# Patient Record
Sex: Female | Born: 1950 | Race: White | Hispanic: No | Marital: Married | Smoking: Never smoker
Health system: Southern US, Community
[De-identification: ages and names within clinical notes are randomized; demographics above are authoritative.]

## PROBLEM LIST (undated history)

## (undated) DIAGNOSIS — M8580 Other specified disorders of bone density and structure, unspecified site: Secondary | ICD-10-CM

## (undated) DIAGNOSIS — J189 Pneumonia, unspecified organism: Secondary | ICD-10-CM

## (undated) DIAGNOSIS — I10 Essential (primary) hypertension: Secondary | ICD-10-CM

## (undated) HISTORY — PX: TUBAL LIGATION: SHX77

## (undated) HISTORY — DX: Essential (primary) hypertension: I10

## (undated) HISTORY — PX: APPENDECTOMY: SHX54

## (undated) HISTORY — PX: ANKLE SURGERY: SHX546

## (undated) HISTORY — DX: Other specified disorders of bone density and structure, unspecified site: M85.80

## (undated) HISTORY — DX: Pneumonia, unspecified organism: J18.9

## (undated) HISTORY — PX: OTHER SURGICAL HISTORY: SHX169

---

## 1996-07-03 HISTORY — PX: OTHER SURGICAL HISTORY: SHX169

## 1998-02-23 ENCOUNTER — Encounter: Payer: Self-pay | Admitting: *Deleted

## 1998-02-23 ENCOUNTER — Ambulatory Visit (HOSPITAL_COMMUNITY): Admission: RE | Admit: 1998-02-23 | Discharge: 1998-02-23 | Payer: Self-pay | Admitting: *Deleted

## 1998-03-02 ENCOUNTER — Other Ambulatory Visit: Admission: RE | Admit: 1998-03-02 | Discharge: 1998-03-02 | Payer: Self-pay | Admitting: *Deleted

## 1999-03-17 ENCOUNTER — Ambulatory Visit (HOSPITAL_COMMUNITY): Admission: RE | Admit: 1999-03-17 | Discharge: 1999-03-17 | Payer: Self-pay | Admitting: *Deleted

## 1999-03-17 ENCOUNTER — Encounter: Payer: Self-pay | Admitting: *Deleted

## 1999-03-23 ENCOUNTER — Other Ambulatory Visit: Admission: RE | Admit: 1999-03-23 | Discharge: 1999-03-23 | Payer: Self-pay | Admitting: *Deleted

## 1999-03-24 ENCOUNTER — Encounter (INDEPENDENT_AMBULATORY_CARE_PROVIDER_SITE_OTHER): Payer: Self-pay

## 1999-03-24 ENCOUNTER — Other Ambulatory Visit: Admission: RE | Admit: 1999-03-24 | Discharge: 1999-03-24 | Payer: Self-pay | Admitting: *Deleted

## 2000-01-11 ENCOUNTER — Encounter: Payer: Self-pay | Admitting: Vascular Surgery

## 2000-01-12 ENCOUNTER — Encounter (INDEPENDENT_AMBULATORY_CARE_PROVIDER_SITE_OTHER): Payer: Self-pay | Admitting: Specialist

## 2000-01-12 ENCOUNTER — Ambulatory Visit (HOSPITAL_COMMUNITY): Admission: RE | Admit: 2000-01-12 | Discharge: 2000-01-12 | Payer: Self-pay | Admitting: Vascular Surgery

## 2000-05-01 ENCOUNTER — Encounter: Payer: Self-pay | Admitting: *Deleted

## 2000-05-01 ENCOUNTER — Ambulatory Visit (HOSPITAL_COMMUNITY): Admission: RE | Admit: 2000-05-01 | Discharge: 2000-05-01 | Payer: Self-pay | Admitting: *Deleted

## 2000-05-29 ENCOUNTER — Other Ambulatory Visit: Admission: RE | Admit: 2000-05-29 | Discharge: 2000-05-29 | Payer: Self-pay | Admitting: *Deleted

## 2001-09-11 ENCOUNTER — Encounter: Payer: Self-pay | Admitting: *Deleted

## 2001-09-11 ENCOUNTER — Ambulatory Visit (HOSPITAL_COMMUNITY): Admission: RE | Admit: 2001-09-11 | Discharge: 2001-09-11 | Payer: Self-pay | Admitting: Obstetrics and Gynecology

## 2001-09-30 ENCOUNTER — Other Ambulatory Visit: Admission: RE | Admit: 2001-09-30 | Discharge: 2001-09-30 | Payer: Self-pay | Admitting: *Deleted

## 2002-10-28 ENCOUNTER — Ambulatory Visit (HOSPITAL_COMMUNITY): Admission: RE | Admit: 2002-10-28 | Discharge: 2002-10-28 | Payer: Self-pay | Admitting: Obstetrics and Gynecology

## 2002-10-28 ENCOUNTER — Encounter: Payer: Self-pay | Admitting: *Deleted

## 2002-11-24 ENCOUNTER — Other Ambulatory Visit: Admission: RE | Admit: 2002-11-24 | Discharge: 2002-11-24 | Payer: Self-pay | Admitting: Obstetrics & Gynecology

## 2003-04-03 ENCOUNTER — Ambulatory Visit (HOSPITAL_COMMUNITY): Admission: RE | Admit: 2003-04-03 | Discharge: 2003-04-03 | Payer: Self-pay | Admitting: Gastroenterology

## 2004-02-09 ENCOUNTER — Ambulatory Visit (HOSPITAL_COMMUNITY): Admission: RE | Admit: 2004-02-09 | Discharge: 2004-02-09 | Payer: Self-pay | Admitting: Obstetrics & Gynecology

## 2005-02-24 ENCOUNTER — Ambulatory Visit (HOSPITAL_COMMUNITY): Admission: RE | Admit: 2005-02-24 | Discharge: 2005-02-24 | Payer: Self-pay | Admitting: Obstetrics & Gynecology

## 2006-10-24 ENCOUNTER — Ambulatory Visit (HOSPITAL_COMMUNITY): Admission: RE | Admit: 2006-10-24 | Discharge: 2006-10-24 | Payer: Self-pay | Admitting: Obstetrics & Gynecology

## 2008-05-21 ENCOUNTER — Ambulatory Visit (HOSPITAL_COMMUNITY): Admission: RE | Admit: 2008-05-21 | Discharge: 2008-05-21 | Payer: Self-pay | Admitting: Obstetrics & Gynecology

## 2008-12-28 ENCOUNTER — Ambulatory Visit: Payer: Self-pay | Admitting: Vascular Surgery

## 2009-04-05 ENCOUNTER — Ambulatory Visit: Payer: Self-pay | Admitting: Vascular Surgery

## 2009-06-14 ENCOUNTER — Ambulatory Visit: Payer: Self-pay | Admitting: Vascular Surgery

## 2009-06-22 ENCOUNTER — Ambulatory Visit: Payer: Self-pay | Admitting: Vascular Surgery

## 2009-08-05 ENCOUNTER — Ambulatory Visit: Payer: Self-pay | Admitting: Vascular Surgery

## 2009-09-29 ENCOUNTER — Ambulatory Visit (HOSPITAL_COMMUNITY): Admission: RE | Admit: 2009-09-29 | Discharge: 2009-09-29 | Payer: Self-pay | Admitting: Obstetrics & Gynecology

## 2010-11-15 NOTE — Consult Note (Signed)
NEW PATIENT CONSULTATION   Anna Sparks, Anna Sparks  DOB:  11/07/1950                                       12/28/2008  EAVWU#:[id number]   The patient is a 60 year old female patient well a long history of  venous insufficiency.  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  She has had some sclerotherapy several years ago for  these.  Her biggest problem today is symptomatic varicosities in the  right leg which have increased over the years and caused aching,  throbbing and burning discomfort in the right leg as well as swelling in  the right ankle as the day progresses.  She has no history of deep  venous thrombosis or thrombophlebitis.  No history of bleeding or  ulceration.  She does have swelling, as noted, in the right ankle and  increasing symptomatology as the day progresses.  She has not been  wearing elastic compression stockings nor elevating the legs on a  regular basis or taking pain medication.  She does feel that this is  affecting her daily living, however.   PAST MEDICAL HISTORY:  1. Hypertension.  2. Negative for coronary artery disease, diabetes, COPD or stroke.   PAST SURGICAL HISTORY:  1. Ligation and stripping left greater saphenous vein.  2. Appendectomy.  3. C-section x2.   FAMILY HISTORY:  Positive for stroke and valve replacement in her  mother, varicose veins, aneurysm and coronary artery disease in her  father.  Negative for diabetes.   SOCIAL HISTORY:  She is married and has 2 children, is retired.  Does  not use tobacco and drinks occasional alcohol.   REVIEW OF SYSTEMS:  Negative with no history of chest pain but does have  occasional palpitations.  Otherwise, no symptomatology.   ALLERGIES:  None known.   MEDICATIONS:  Please see health history form.   PHYSICAL  EXAMINATION:  Blood pressure is 130/80, heart rate 70,  respirations 14.  General:  She is a healthy-appearing middle-aged  female in no apparent distress, alert and oriented x3.  Neck:  Supple,  3+ carotid pulses palpable.  No bruits are audible.  Neurologic:  Normal.  No palpable adenopathy in the neck.  Chest:  Clear to  auscultation.  Cardiovascular:  Regular rhythm, no murmurs.  Abdomen:  Soft, nontender with no masses.  She has 3+ femoral, popliteal and  dorsalis pedis pulses bilaterally.  Both feet are well-perfused.  Right  leg has severe varicosities in the greater saphenous system in the  distal thigh and more severely in the medial calf with large bulging  varicosities.  There is also a prominent varicosity in the lower third  of the leg extending down laterally to the ankle with some spider and  reticular veins.  There is 1+ edema but no ulceration noted.  Left leg  has recurrent varicosities in the medial thigh which are smaller and  extend down into the medial calf with 1+ edema distally.  No  hyperpigmentation or ulceration is noted in either.   Venous duplex exam reveals the following:  1. Gross reflux in the right great saphenous system from the      saphenofemoral junction to  the knee.  2. Reflux in the deep system on the right side in the common femoral,      superficial femoral and popliteal veins in no deep venous      obstruction.  3. Left great saphenous vein is absent.  There is some reflux from the      knee to the ankle through a very tortuous vein in the left leg,      although no incompetent perforators are noted and there is deep      reflux on the left side as well.   I think the best plan would be to treat her for 3 months with long-leg  elastic compression stockings, elevation and ibuprofen on a daily basis.   If she has no improvement, she would be a good candidate for:  1. Laser ablation of the right great saphenous vein with multiple stab       phlebectomies to be followed by a course of sclerotherapy on the      right.  2. She will consider sclerotherapy for recurrent varicosities in the      left thigh and calf.  She will return in 3 months.   Anna Sparks, M.D.  Electronically Signed   JDL/MEDQ  D:  12/28/2008  T:  12/29/2008  Job:  [id number]   cc:   Gloriajean Dell. Andrey Campanile, M.D.

## 2010-11-15 NOTE — Procedures (Signed)
LOWER EXTREMITY VENOUS REFLUX EXAM   INDICATION:  Bilateral varicose veins.   EXAM:  Using color-flow imaging and pulse Doppler spectral analysis, the  bilateral common femoral, superficial femoral, popliteal, posterior  tibial, greater and lesser saphenous veins were evaluated.  There is  evidence suggesting deep venous insufficiency in the bilateral common  femoral, superficial femoral, and popliteal veins.   The bilateral saphenofemoral junctions are not competent.  The right GSV  is not competent ( with reflux of > 500 millisecond) , with the caliber  as described below.  The left greater saphenous vein is not visualized  from the groin to distal thigh level.   The left proximal short saphenous vein demonstrates competency.  The  right proximal short saphenous vein was not visualized.   GSV Diameter (used if found to be incompetent only)                                            Right    Left  Proximal Greater Saphenous Vein           0.97 cm  cm  Proximal-to-mid-thigh                     cm       cm  Mid thigh                                 0.85 cm  cm  Mid-distal thigh                           cm      cm  Distal thigh                               0.72 cm cm  Knee                                      0.9 cm   cm   IMPRESSION:  1. Right greater saphenous vein reflux ( with reflux of  >500      millisecond) is identified as described above.  2. The right greater saphenous vein is not tortuous.  3. The bilateral deep venous systems are not competent ( with reflux      of > 500 millisecond) .  4. An incompetent perforator is noted at the distal thigh level, as      described on the attached worksheet.  5. The left lesser saphenous vein is competent.        ___________________________________________  Quita Skye. Hart Rochester, M.D.   CH/MEDQ  D:  12/28/2008  T:  12/28/2008  Job:  [id number]

## 2010-11-15 NOTE — Assessment & Plan Note (Signed)
OFFICE VISIT   PAIDYN, MCFERRAN  DOB:  1950/10/07                                       06/22/2009  AVWUJ#:[id number]   The patient returns 1 week post laser ablation of the right great  saphenous vein with multiple stab phlebectomies for venous hypertension  and painful varicosities.  She has had some mild discomfort along the  course of the great saphenous vein but no distal edema.  She has been  wearing her elastic compression stocking and taking her ibuprofen as  directed.  On exam she has no distal edema and a palpable dorsalis pedis  pulse.  There is mild discomfort along the course of the great saphenous  vein in the mid thigh up to the saphenofemoral junction area.  Venous  duplex reveals no evidence of deep venous obstruction and total closure  of the great saphenous vein from the knee to the saphenofemoral  junction.  I reassured her regarding these findings.  She will return in  February for sclerotherapy in the contralateral left leg.     [name], M.D.  Electronically Signed   JDL/MEDQ  D:  06/22/2009  T:  06/23/2009  Job:  [id number]

## 2010-11-15 NOTE — Procedures (Signed)
DUPLEX DEEP VENOUS EXAM - LOWER EXTREMITY   INDICATION:  Follow up right greater saphenous vein ablation.   HISTORY:  Edema:  No  Trauma/Surgery:  Right greater saphenous vein ablation 06/14/2009  Pain:  No  PE:  No  Previous DVT:  No  Anticoagulants:  No   DUPLEX EXAM:                CFV   SFV   PopV  PTV    GSV                R  L  R  L  R  L  R   L  R  L  Thrombosis    0  0  0     0     0      +  Spontaneous   +  +  +     +     +      0  Phasic        +  +  +     +     +      0  Augmentation  +  +  +     +     +      0  Compressible  +  +  +     +     +      0  Competent     0  0  0     0     +      0   Legend:  + - yes  o - no  p - partial  D - decreased   IMPRESSION:  1. No evidence of a deep vein thrombosis in the right lower extremity      or left common femoral vein.  2. Evidence of right greater saphenous vein ablation throughout its      length without flow.  Greater saphenous vein thrombus extends to      saphenofemoral junction valve, however does not extend into the      common femoral vein.  3. Bilateral deep venous reflux noted.          _____________________________  [name], M.D.   AS/MEDQ  D:  06/22/2009  T:  06/23/2009  Job:  [id number]

## 2010-11-15 NOTE — Assessment & Plan Note (Signed)
OFFICE VISIT   Anna Sparks, Anna Sparks  DOB:  [birth year]                                       04/05/2009  AVWUJ#:[id number]   The patient returns today with continued symptoms related to her severe  venous insufficiency of the right leg secondary to gross reflux in the  right great saphenous vein and multiple varicosities.  She continues to  have aching, throbbing and burning discomfort not only when up and  ambulating, taking care of her normal activities such as cooking and  housework, but also has problems with aching and throbbing discomfort  while sitting despite wearing elastic compression stockings, taking  ibuprofen and elevating the legs as much as possible.  She has  documented reflux throughout the right great saphenous vein down to the  knee with secondary varicosities.  She also has bulging reticular veins  at the ankle with secondary spider veins.  She has previously had  ligation and stripping of her contralateral left great saphenous vein.   I think she is having significant symptomatology secondary to this which  is affecting her daily living and recommend that we should proceed with  laser ablation of the right great saphenous vein with multiple stab  phlebectomies to be followed by one course of sclerotherapy in the right  leg.  We will proceed with precertification for this and try to perform  it in the near future to help relieve this nice lady's symptoms.   [name], M.D.  Electronically Signed   JDL/MEDQ  D:  04/05/2009  T:  04/06/2009  Job:  [id number]

## 2010-11-18 NOTE — Op Note (Signed)
[facility]  Patient:    Anna Sparks, Anna Sparks                         MRN: [id number] Proc. Date: 01/12/00 Attending:  Quita Skye. Hart Rochester, M.D.                           Operative Report  PREOPERATIVE DIAGNOSIS:  Severe varicose veins greater saphenous system, left leg.  POSTOPERATIVE DIAGNOSIS:  Severe varicose veins greater saphenous system, left leg.  OPERATION PERFORMED:  Ligation and stripping of greater saphenous vein, left leg with excision of multiple varicosities left leg.  SURGEON:  Quita Skye. Hart Rochester, M.D.  FIRST ASSISTANT:  Nurse.  ANESTHESIA:  General endotracheal.  DESCRIPTION OF PROCEDURE:  The patient was taken to the operating room, placed in the upright position at which time the varicosities in the left leg were marked. These were mainly in the calf over the greater saphenous vein and extending posteriorly in the mid calf region. There were also 1 or 2 isolated varicosities in the thigh. The patient was returned in the supine position and general endotracheal anesthesia was administered. The left leg was prepped with Betadine solution and draped in a routine sterile manner. An incision was made anterior to the medial malleolus at the ankle in the left leg and the greater saphenous vein ligated distally and transected. The vein was occluded up at at least the mid calf region and an intraluminal stripper could not be passed. It was removed as much as possible through this incision. An incision was then made in the groin at the saphenofemoral junction through a short oblique incision. The saphenofemoral junction was dissected free and the saphenous vein ligated at the junction of the femoral vein. Its branches were ligated with 4-0 silk ties and divided. The vein was then opened and the stripper passed distally through the saphenous vein and was palpated at the knee level where a transverse incision was made over a nest of varicosities. The  vein was then transected proximally and a medium size stripper head secured with the 2-0 silk tie. Multiple short transverse incisions were made over marked varicosities and they were removed using both dissection and avulsion techniques including a prominent nest of varicosities near the ankle and in the distal calf. When these were all removed, the leg was wrapped with a 4 inch Ace wrap and Trendelenburg position employed. The vein was then stripped from proximal to distal and compression applied for 10 minutes. The wounds were then closed in a subcuticular fashion with 3-0 Vicryl and Steri-Strips. Sterile dressing applied consisting of 4 x 4s, Webril and an Ace wrap. The patient taken to the recovery room in satisfactory condition. DD:  01/12/00 TD:  01/12/00 Job: [id number]

## 2010-11-18 NOTE — Op Note (Signed)
   NAME:  [name], Anna Sparks                    ACCOUNT NO.:  [id number]   MEDICAL RECORD NO.:  [id number]                   PATIENT TYPE:  AMB   LOCATION:  ENDO                                 FACILITY:  [facility]   PHYSICIAN:  Anselmo Rod, M.D.               DATE OF BIRTH:  1951-07-01   DATE OF PROCEDURE:  04/03/2003  DATE OF DISCHARGE:                                 OPERATIVE REPORT   PROCEDURE:  Screening colonoscopy.   ENDOSCOPIST:  Anselmo Rod, M.D.   INSTRUMENT USED:  Olympus video colonoscope.   INDICATIONS FOR PROCEDURE:  A 60 year old white female undergoing a  screening colonoscopy to rule out colonic polyps, masses, etc.   PREPROCEDURE PREPARATION:  Informed consent was procured from the patient.  The patient was fasted for eight hours prior to the procedure and prepped  with a bottle of magnesium citrate and a gallon of GoLYTELY the night prior  to the procedure.   PREPROCEDURE PHYSICAL EXAMINATION:  VITAL SIGNS: Stable.  NECK:  Supple.  CHEST: Clear to auscultation. S1 and S2 regular.  ABDOMEN: Soft with normal bowel sounds.   DESCRIPTION OF PROCEDURE:  The patient was placed in the left lateral  decubitus position and sedated with 50 mg of Demerol and 5 mg of Versed  intravenously. Once the patient was adequately sedated and maintained on low  flow oxygen and continuous cardiac monitoring, the Olympus video colonoscope  was advanced from the rectum to the cecum and terminal ileum without  difficulty.  Multiple washings were done. No masses, polyps, erosions,  ulcerations, or diverticula were seen.  Small external hemorrhoid was seen  on anal inspection.  Retroflexion in the rectum revealed no abnormalities.  The appendiceal orifice and ileocecal valve were clearly visualized and  photographed. The terminal ileum appeared normal and without lesions.   IMPRESSION:  Normal colonoscopy up to the terminal ileum except for a small  external hemorrhoid.    RECOMMENDATIONS:  Continue a high fiber diet with liberal fluid intake.  Repeat CRC screening in the next 10 years unless the patient develops any  abnormal symptoms in the interim.  Outpatient follow-up on a p.r.n. basis.                                               Anselmo Rod, M.D.    JNM/MEDQ  D:  04/03/2003  T:  04/03/2003  Job:  [id number]   cc:   Genia Del, M.D.  [address]  [place]  [place] [address]  Fax: [phone]

## 2011-11-20 ENCOUNTER — Other Ambulatory Visit (HOSPITAL_COMMUNITY): Payer: Self-pay | Admitting: Obstetrics & Gynecology

## 2011-11-20 DIAGNOSIS — Z1231 Encounter for screening mammogram for malignant neoplasm of breast: Secondary | ICD-10-CM

## 2011-11-21 ENCOUNTER — Ambulatory Visit (HOSPITAL_COMMUNITY)
Admission: RE | Admit: 2011-11-21 | Discharge: 2011-11-21 | Disposition: A | Discharge status: Home / Self Care | Payer: 59 | Source: Ambulatory Visit | Attending: Obstetrics & Gynecology | Admitting: Obstetrics & Gynecology

## 2011-11-21 DIAGNOSIS — Z1231 Encounter for screening mammogram for malignant neoplasm of breast: Secondary | ICD-10-CM | POA: Insufficient documentation

## 2013-04-09 ENCOUNTER — Other Ambulatory Visit (HOSPITAL_COMMUNITY): Payer: Self-pay | Admitting: Obstetrics & Gynecology

## 2013-04-09 DIAGNOSIS — Z1231 Encounter for screening mammogram for malignant neoplasm of breast: Secondary | ICD-10-CM

## 2013-04-10 ENCOUNTER — Ambulatory Visit (HOSPITAL_COMMUNITY)
Admission: RE | Admit: 2013-04-10 | Discharge: 2013-04-10 | Disposition: A | Discharge status: Home / Self Care | Payer: 59 | Source: Ambulatory Visit | Attending: Obstetrics & Gynecology | Admitting: Obstetrics & Gynecology

## 2013-04-10 DIAGNOSIS — Z1231 Encounter for screening mammogram for malignant neoplasm of breast: Secondary | ICD-10-CM | POA: Insufficient documentation

## 2015-04-13 ENCOUNTER — Telehealth: Payer: Self-pay

## 2015-04-13 NOTE — Telephone Encounter (Signed)
Phone call from pt. with report of onset of a "dull ache in the right calf", when she got up this AM.  Reported the aching of the calf starts with walking only a few steps.  Stated it improved with sitting and resting.  Stated she is driving now, and doesn't feel the aching of right calf.  Denied any visible signs of inflammation; denied any swelling, redness, or warmth of right LE.  Denied any recent physical strain to the right LE.  Recommended to notify her PCP for initial evaluation; if PCP recommends to see Vascular Surgery, our office will, then, schedule appt.  Pt. verb. understanding.

## 2016-12-07 ENCOUNTER — Ambulatory Visit (INDEPENDENT_AMBULATORY_CARE_PROVIDER_SITE_OTHER): Payer: Medicare Other

## 2016-12-07 ENCOUNTER — Ambulatory Visit (INDEPENDENT_AMBULATORY_CARE_PROVIDER_SITE_OTHER): Payer: Medicare Other | Admitting: Orthopedic Surgery

## 2016-12-07 ENCOUNTER — Encounter (INDEPENDENT_AMBULATORY_CARE_PROVIDER_SITE_OTHER): Payer: Self-pay | Admitting: Orthopedic Surgery

## 2016-12-07 VITALS — Ht 64.0 in | Wt 170.0 lb

## 2016-12-07 DIAGNOSIS — M25561 Pain in right knee: Secondary | ICD-10-CM

## 2016-12-07 DIAGNOSIS — M25572 Pain in left ankle and joints of left foot: Secondary | ICD-10-CM | POA: Diagnosis not present

## 2016-12-07 DIAGNOSIS — S82851S Displaced trimalleolar fracture of right lower leg, sequela: Secondary | ICD-10-CM | POA: Diagnosis not present

## 2016-12-07 DIAGNOSIS — M25571 Pain in right ankle and joints of right foot: Secondary | ICD-10-CM

## 2016-12-07 DIAGNOSIS — S82851A Displaced trimalleolar fracture of right lower leg, initial encounter for closed fracture: Secondary | ICD-10-CM | POA: Insufficient documentation

## 2016-12-07 NOTE — Progress Notes (Signed)
Office Visit Note   Patient: Anna Sparks           Date of Birth: 1951/04/04           MRN: [id number] Visit Date: 12/07/2016              Requested by: No referring provider defined for this encounter. PCP: No primary care provider on file.  Chief Complaint  Patient presents with  . Right Ankle - Pain    Surgery in [place] about 11 days ago s/p fall  . Left Ankle - Pain  . Right Knee - Pain      HPI: Patient is a 66 year old woman who fell while in [place]. She underwent open reduction internal fixation of the medial and lateral malleolus fracture 4 a Weber B fracture. Patient also had a contusion to her right knee and left ankle and states that there was a concern while she was in [place] that there may be a fracture of her knee and ankle. Patient is approximately 2 weeks status post surgery.  Assessment & Plan: Visit Diagnoses:  1. Acute pain of right knee   2. Pain in left ankle and joints of left foot   3. Pain in right ankle and joints of right foot   4. Closed trimalleolar fracture of right ankle, sequela     Plan: We will harvest the sutures today H was given instructions for ankle range of motion she will be touchdown weightbearing on the right where fracture boot. She will wear a 15-20 mm compression stocking to decrease the swelling she will work on active and passive range of motion of the ankle. She was given instructions for scar massage.  Follow-up in the office in 2 weeks with repeat 3 view radiographs of the right ankle.  Follow-Up Instructions: Return in about 2 weeks (around 12/21/2016).   Ortho Exam  Patient is alert, oriented, no adenopathy, well-dressed, normal affect, normal respiratory effort. Examination patient's left ankle has no abrasion or redness no cellulitis there is no tenderness to palpation around the medial or lateral malleolus anterior talofibular ligament and deltoid ligament are nontender to palpation. Examination the right knee  there is no effusion. Left knee she does have a small abrasion on both knees medial and lateral joint line are nontender to palpation. The proximal tibia and distal femur nontender to palpation the patella is nontender and there is no palpable defect of the extensor mechanism.  Imaging: Xr Ankle Complete Left  Result Date: 12/07/2016 Two-view radiographs of the left ankle shows no bony abnormalities a congruent mortise no evidence of fracture.  Xr Ankle Complete Right  Result Date: 12/07/2016 Three-view radiographs of the right ankle shows a congruent mortise stable internal fixation for the medial and lateral malleolus. No complicating features.  Xr Knee 3 View Right  Result Date: 12/07/2016 Three-view radiographs of the right knee shows no evidence of a tibial plateau fracture and no evidence of a distal femur fracture there is no effusion.   Labs: No results found for: HGBA1C, ESRSEDRATE, CRP, LABURIC, REPTSTATUS, GRAMSTAIN, CULT, LABORGA  Orders:  Orders Placed This Encounter  Procedures  . XR KNEE 3 VIEW RIGHT  . XR Ankle Complete Left  . XR Ankle Complete Right   No orders of the defined types were placed in this encounter.    Procedures: No procedures performed  Clinical Data: No additional findings.  ROS:  All other systems negative, except as noted in the HPI. Review of Systems  Objective: Vital Signs: Ht 5\' 4"  (1.626 m)   Wt 170 lb (77.1 kg)   BMI 29.18 kg/m   Specialty Comments:  No specialty comments available.  PMFS History: Patient Active Problem List   Diagnosis Date Noted  . Closed trimalleolar fracture of right ankle 12/07/2016  . Pain in left ankle and joints of left foot 12/07/2016   No past medical history on file.  No family history on file.  No past surgical history on file. Social History   Occupational History  . Not on file.   Social History Main Topics  . Smoking status: Never Smoker  . Smokeless tobacco: Never Used  . Alcohol  use Not on file  . Drug use: No  . Sexual activity: Not on file

## 2016-12-08 ENCOUNTER — Telehealth (INDEPENDENT_AMBULATORY_CARE_PROVIDER_SITE_OTHER): Payer: Self-pay | Admitting: Orthopedic Surgery

## 2016-12-08 NOTE — Telephone Encounter (Signed)
Patient called asked for a call back concerning caring for her ankle. The number to contact patient is [phone]

## 2016-12-11 NOTE — Telephone Encounter (Signed)
I called and spoke with patient, advised to  Hold off on soaking foot/leg until incision is completely healed. All other questions answered.

## 2016-12-25 ENCOUNTER — Encounter (INDEPENDENT_AMBULATORY_CARE_PROVIDER_SITE_OTHER): Payer: Self-pay | Admitting: Orthopedic Surgery

## 2016-12-25 ENCOUNTER — Ambulatory Visit (INDEPENDENT_AMBULATORY_CARE_PROVIDER_SITE_OTHER): Payer: Medicare Other

## 2016-12-25 ENCOUNTER — Ambulatory Visit (INDEPENDENT_AMBULATORY_CARE_PROVIDER_SITE_OTHER): Payer: Medicare Other | Admitting: Orthopedic Surgery

## 2016-12-25 VITALS — Ht 64.0 in | Wt 170.0 lb

## 2016-12-25 DIAGNOSIS — M25571 Pain in right ankle and joints of right foot: Secondary | ICD-10-CM

## 2016-12-25 DIAGNOSIS — S82851D Displaced trimalleolar fracture of right lower leg, subsequent encounter for closed fracture with routine healing: Secondary | ICD-10-CM | POA: Diagnosis not present

## 2016-12-25 NOTE — Progress Notes (Signed)
   Office Visit Note   Patient: Anna Sparks           Date of Birth: 21-Jul-1950           MRN: [id number] Visit Date: 12/25/2016              Requested by: No referring provider defined for this encounter. PCP: Barbie BannerWilson, Fred H, MD  Chief Complaint  Patient presents with  . Right Ankle - Follow-up    [place]gery in [place] end of May for a right trimal ankle fracture.       HPI: Patient is a 66 year old woman who fell while in [place]. She underwent open reduction internal fixation of the medial and lateral malleolus fracture 4 a Weber B fracture. Patient is approximately 4 weeks status post surgery. Has been non-weight bearing with a walker and CAM boot.  Assessment & Plan: Visit Diagnoses:  1. Pain in right ankle and joints of right foot   2. Closed trimalleolar fracture of right ankle with routine healing, subsequent encounter     Plan: Continue ankle range of motion.  she will be full weightbearing on the right in the fracture boot. She will wear a 15-20 mm compression stocking to decrease the swelling she will work on active and passive range of motion of the ankle.   Follow-up in the office in 2 weeks  Follow-Up Instructions: Return in about 2 weeks (around 01/08/2017).   Ortho Exam  Patient is alert, oriented, no adenopathy, well-dressed, normal affect, normal respiratory effort. Right ankle with healed medial incision. Lateral incision is nearly healed, scattered ulceration legthwise. No depth. no drainage, erythema, or sign of infection.   Imaging: Xr Ankle Complete Right  Result Date: 12/25/2016 Three-view radiographs of the right ankle shows a congruent mortise stable internal fixation for the medial and lateral malleolus. No complicating features.   Labs: No results found for: HGBA1C, ESRSEDRATE, CRP, LABURIC, REPTSTATUS, GRAMSTAIN, CULT, LABORGA  Orders:  Orders Placed This Encounter  Procedures  . XR Ankle Complete Right   No orders of the defined types  were placed in this encounter.    Procedures: No procedures performed  Clinical Data: No additional findings.  ROS:  All other systems negative, except as noted in the HPI. Review of Systems  Constitutional: Negative for chills and fever.  Musculoskeletal: Positive for arthralgias and joint swelling.  Skin: Positive for wound.    Objective: Vital Signs: Ht 5\' 4"  (1.626 m)   Wt 170 lb (77.1 kg)   BMI 29.18 kg/m   Specialty Comments:  No specialty comments available.  PMFS History: Patient Active Problem List   Diagnosis Date Noted  . Closed trimalleolar fracture of right ankle 12/07/2016  . Pain in left ankle and joints of left foot 12/07/2016   No past medical history on file.  No family history on file.  No past surgical history on file. Social History   Occupational History  . Not on file.   Social History Main Topics  . Smoking status: Never Smoker  . Smokeless tobacco: Never Used  . Alcohol use Not on file  . Drug use: No  . Sexual activity: Not on file

## 2016-12-28 ENCOUNTER — Telehealth (INDEPENDENT_AMBULATORY_CARE_PROVIDER_SITE_OTHER): Payer: Self-pay

## 2016-12-28 NOTE — Telephone Encounter (Signed)
Patient would like to know if she could put weight on her right ankle or does she need to have some support such as a walker?  CB# is [phone].  Please Advise.

## 2016-12-28 NOTE — Telephone Encounter (Signed)
Called and advised pt per last dictation this Wednesday that she can be full weight bearing in the fracture boot.

## 2017-01-08 ENCOUNTER — Ambulatory Visit (INDEPENDENT_AMBULATORY_CARE_PROVIDER_SITE_OTHER): Payer: Medicare Other | Admitting: Orthopedic Surgery

## 2017-01-08 ENCOUNTER — Ambulatory Visit (INDEPENDENT_AMBULATORY_CARE_PROVIDER_SITE_OTHER): Payer: Medicare Other

## 2017-01-08 DIAGNOSIS — S82851D Displaced trimalleolar fracture of right lower leg, subsequent encounter for closed fracture with routine healing: Secondary | ICD-10-CM

## 2017-01-08 NOTE — Progress Notes (Signed)
   Office Visit Note   Patient: Anna Sparks           Date of Birth: [birth year]           MRN: [id number] Visit Date: 01/08/2017              Requested by: Barbie BannerWilson, Fred H, MD [address] [place], [place] [id number] PCP: Barbie BannerWilson, Fred H, MD  Chief Complaint  Patient presents with  . Right Ankle - Fracture    6 weeks s/p right ankle fracture      HPI: Patient presents she is 6 weeks status post open reduction internal fixation while on vacation for a bimalleolar ankle fracture. She is currently ambulating in a cam boot. Patient states she has just started taking doxycycline for her face.  Assessment & Plan: Visit Diagnoses:  1. Closed trimalleolar fracture of right ankle with routine healing, subsequent encounter     Plan: We'll have her continue with the doxycycline continue with soap cleansing of the incision, continue with the compression stocking she will advance to regular shoewear reevaluate in 2 weeks. If she still has drainage in 2 weeks we will need to remove the deep retained hardware with concerns of deep hardware infection.  Follow-Up Instructions: Return in about 2 weeks (around 01/22/2017).   Ortho Exam  Patient is alert, oriented, no adenopathy, well-dressed, normal affect, normal respiratory effort. Patient is seen for evaluation she has no tenderness to palpation over the medial or lateral malleolus ankle has no pain with range of motion there is no redness no cellulitis she does have a small scab proximally on along the incision. She shows me a photo of her ankle which does show a small drop of drainage from the area beneath the scab which is concerning for deep infection. No clinical signs of infection today no history of fever or chills no redness no tenderness to palpation.  Imaging: Xr Ankle Complete Right  Result Date: 01/08/2017 Three-view radiographs the right ankle shows a congruent mortise with stable internal fixation of the fractures appear  well-healed.   Labs: No results found for: HGBA1C, ESRSEDRATE, CRP, LABURIC, REPTSTATUS, GRAMSTAIN, CULT, LABORGA  Orders:  Orders Placed This Encounter  Procedures  . XR Ankle Complete Right   No orders of the defined types were placed in this encounter.    Procedures: No procedures performed  Clinical Data: No additional findings.  ROS:  All other systems negative, except as noted in the HPI. Review of Systems  Objective: Vital Signs: There were no vitals taken for this visit.  Specialty Comments:  No specialty comments available.  PMFS History: Patient Active Problem List   Diagnosis Date Noted  . Closed trimalleolar fracture of right ankle 12/07/2016  . Pain in left ankle and joints of left foot 12/07/2016   No past medical history on file.  No family history on file.  No past surgical history on file. Social History   Occupational History  . Not on file.   Social History Main Topics  . Smoking status: Never Smoker  . Smokeless tobacco: Never Used  . Alcohol use Not on file  . Drug use: No  . Sexual activity: Not on file

## 2017-01-22 ENCOUNTER — Encounter (INDEPENDENT_AMBULATORY_CARE_PROVIDER_SITE_OTHER): Payer: Self-pay | Admitting: Orthopedic Surgery

## 2017-01-22 ENCOUNTER — Ambulatory Visit (INDEPENDENT_AMBULATORY_CARE_PROVIDER_SITE_OTHER): Payer: Medicare Other | Admitting: Orthopedic Surgery

## 2017-01-22 VITALS — Ht 64.0 in | Wt 170.0 lb

## 2017-01-22 DIAGNOSIS — S82851D Displaced trimalleolar fracture of right lower leg, subsequent encounter for closed fracture with routine healing: Secondary | ICD-10-CM

## 2017-01-22 DIAGNOSIS — I87321 Chronic venous hypertension (idiopathic) with inflammation of right lower extremity: Secondary | ICD-10-CM | POA: Insufficient documentation

## 2017-01-22 NOTE — Progress Notes (Signed)
   Office Visit Note   Patient: Anna Sparks           Date of Birth: Mar 26, 1951           MRN: [id number] Visit Date: 01/22/2017              Requested by: Barbie BannerWilson, Fred H, MD [address] [place], [place] [id number] PCP: Barbie BannerWilson, Fred H, MD  Chief Complaint  Patient presents with  . Right Ankle - Routine Post Op    8 weeks s/p ORIF bimalleolar ankle fracture.       HPI: Patient is a 66 year old woman who presents 8 weeks status post open reduction internal fixation right bimalleolar ankle fracture. She currently endplates with a walker she states she has stiffness and pain with start up also has pain across the midfoot. She will complete her course of doxycycline 2 days.  Assessment & Plan: Visit Diagnoses:  1. Closed trimalleolar fracture of right ankle with routine healing, subsequent encounter   2. Idiopathic chronic venous hypertension of right lower extremity with inflammation     Plan: Recommended continuing with the compression stocking recommended heel cord stretching and this was demonstrated to her. Recommended increase her activities as tolerated. Discussed that we could set her up for physical therapy if she is having difficulty on her own. Three-view radiographs of the right ankle at follow-up.  Follow-Up Instructions: Return in about 4 weeks (around 02/19/2017).   Ortho Exam  Patient is alert, oriented, no adenopathy, well-dressed, normal affect, normal respiratory effort. Examination patient ambulates with a walker. She does have some heel cord tightness with dorsiflexion to neutral with her knee extended. She has good pulses. The scab is removed and there is good epithelialization beneath the scab. There is no purulence no drainage no exposed hardware no signs of infection. She does have pitting edema up to the tibial tubercle with no venous ulcers no brawny skin color changes.  Imaging: No results found.  Labs: No results found for: HGBA1C, ESRSEDRATE,  CRP, LABURIC, REPTSTATUS, GRAMSTAIN, CULT, LABORGA  Orders:  No orders of the defined types were placed in this encounter.  No orders of the defined types were placed in this encounter.    Procedures: No procedures performed  Clinical Data: No additional findings.  ROS:  All other systems negative, except as noted in the HPI. Review of Systems  Objective: Vital Signs: Ht 5\' 4"  (1.626 m)   Wt 170 lb (77.1 kg)   BMI 29.18 kg/m   Specialty Comments:  No specialty comments available.  PMFS History: Patient Active Problem List   Diagnosis Date Noted  . 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 Closed trimalleolar fracture of right ankle 12/07/2016  . Pain in left ankle and joints of left foot 12/07/2016   No past medical history on file.  No family history on file.  No past surgical history on file. Social History   Occupational History  . Not on file.   Social History Main Topics  . Smoking status: Never Smoker  . Smokeless tobacco: Never Used  . Alcohol use Not on file  . Drug use: No  . Sexual activity: Not on file

## 2017-01-26 ENCOUNTER — Telehealth (INDEPENDENT_AMBULATORY_CARE_PROVIDER_SITE_OTHER): Payer: Self-pay | Admitting: Orthopedic Surgery

## 2017-01-26 NOTE — Telephone Encounter (Signed)
Called and sw pt to advise.  

## 2017-01-26 NOTE — Telephone Encounter (Signed)
Patient called needing to know if she need to continue the high dosage of vitamin D3 now that she's almost healed. Patient said she is taking 5000 IU D3. The number to contact patient is [phone]

## 2017-01-26 NOTE — Telephone Encounter (Signed)
Please advise 

## 2017-02-19 ENCOUNTER — Ambulatory Visit (INDEPENDENT_AMBULATORY_CARE_PROVIDER_SITE_OTHER): Payer: Medicare Other

## 2017-02-19 ENCOUNTER — Ambulatory Visit (INDEPENDENT_AMBULATORY_CARE_PROVIDER_SITE_OTHER): Payer: Medicare Other | Admitting: Orthopedic Surgery

## 2017-02-19 ENCOUNTER — Encounter (INDEPENDENT_AMBULATORY_CARE_PROVIDER_SITE_OTHER): Payer: Self-pay | Admitting: Orthopedic Surgery

## 2017-02-19 DIAGNOSIS — S82851D Displaced trimalleolar fracture of right lower leg, subsequent encounter for closed fracture with routine healing: Secondary | ICD-10-CM

## 2017-02-19 NOTE — Progress Notes (Signed)
   Office Visit Note   Patient: Anna Sparks           Date of Birth: 1950-08-01           MRN: [id number] Visit Date: 02/19/2017              Requested by: Barbie Banner, MD [address] [place], [place] [address] PCP: Barbie Banner, MD  Chief Complaint  Patient presents with  . Right Ankle - Pain      HPI: Patient is a 66 year old woman who presents follow-up status post open reduction internal fixation bimalleolar right ankle fracture. Patient is completing course of doxycycline and she is wearing compression stockings ambulating with a cane she is given a prescription for physical therapy in [place] with [facility]. Patient feels some crepitation anteriorly in the ankle with range of motion and complains of some swelling over the posterior tibial tendon.  Assessment & Plan: Visit Diagnoses:  1. Closed trimalleolar fracture of right ankle with routine healing, subsequent encounter     Plan: Recommended physical therapy with steroid therapy in [place] for range of motion modalities and strengthening as well as proprioception and balance. Patient will follow-up as needed. Discussed that if the anterior impingement symptoms persist we could consider a steroid injection and also could consider possible arthroscopic debridement.  Follow-Up Instructions: Return if symptoms worsen or fail to improve.   Ortho Exam  Patient is alert, oriented, no adenopathy, well-dressed, normal affect, normal respiratory effort. Examination patient has an antalgic gait use a cane for ambulation. Patient has some venous stasis swelling incision is well-healed there is no redness no open ulcers there is no scab there is no signs of infection. There is no tenderness to palpation. Patient has good range of motion of her ankle anterior drawer is stable no ligamentous instability the bone is nontender to palpation. Patient does have some swelling over the posterior tibial tendon but  this is nontender to palpation.  Imaging: No results found. No images are attached to the encounter.  Labs: No results found for: HGBA1C, ESRSEDRATE, CRP, LABURIC, REPTSTATUS, GRAMSTAIN, CULT, LABORGA  Orders:  Orders Placed This Encounter  Procedures  . XR Ankle Complete Right   No orders of the defined types were placed in this encounter.    Procedures: No procedures performed  Clinical Data: No additional findings.  ROS:  All other systems negative, except as noted in the HPI. Review of Systems  Objective: Vital Signs: There were no vitals taken for this visit.  Specialty Comments:  No specialty comments available.  PMFS History: Patient Active Problem List   Diagnosis Date Noted  . 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 Closed trimalleolar fracture of right ankle 12/07/2016  . Pain in left ankle and joints of left foot 12/07/2016   History reviewed. No pertinent past medical history.  History reviewed. No pertinent family history.  History reviewed. No pertinent surgical history. Social History   Occupational History  . Not on file.   Social History Main Topics  . Smoking status: Never Smoker  . Smokeless tobacco: Never Used  . Alcohol use Not on file  . Drug use: No  . Sexual activity: Not on file

## 2017-03-27 ENCOUNTER — Ambulatory Visit (INDEPENDENT_AMBULATORY_CARE_PROVIDER_SITE_OTHER): Payer: Medicare Other | Admitting: Orthopedic Surgery

## 2017-03-27 DIAGNOSIS — T8189XS Other complications of procedures, not elsewhere classified, sequela: Secondary | ICD-10-CM | POA: Diagnosis not present

## 2017-03-27 DIAGNOSIS — Z189 Retained foreign body fragments, unspecified material: Secondary | ICD-10-CM

## 2017-03-27 NOTE — Progress Notes (Signed)
   Office Visit Note   Patient: Anna Sparks           Date of Birth: 08/30/1950           MRN: [id number] Visit Date: 03/27/2017              Requested by: Barbie Banner, MD [address] [place], [place] [address] PCP: Barbie Banner, MD  No chief complaint on file.     HPI: Patient 66 year old who is status post open reduction internal fixation bimalleolar ankle fracture in [place]. Patient states she's been having 2 areas with drainage on the superior aspect of the right medial incision. She feels like there may be some retained suture that needs to be removed.  Assessment & Plan: Visit Diagnoses:  1. Retained suture, sequela     Plan: To pieces of retained Prolene suture was removed. Bactroban a Band-Aid was applied she will begin normal most rising lotion and normal sock where tomorrow  Follow-Up Instructions: Return if symptoms worsen or fail to improve.   Ortho Exam  Patient is alert, oriented, no adenopathy, well-dressed, normal affect, normal respiratory effort. Examination there is no redness or cellulitis over either incision. There are 2 very small ulcer proximal medially. After informed consent a needle driver was used and 2 small pieces of Prolene suture were removed these were blue sutures. Proximally 3-0 suture. There was good granulation tissue at the base of the wound this did not penetrate to bone or tendon there is no cellulitis drainage or odor. There is no tenderness to palpation. Patient has excellent range of motion of her ankle.  Imaging: No results found. No images are attached to the encounter.  Labs: No results found for: HGBA1C, ESRSEDRATE, CRP, LABURIC, REPTSTATUS, GRAMSTAIN, CULT, LABORGA  Orders:  No orders of the defined types were placed in this encounter.  No orders of the defined types were placed in this encounter.    Procedures: No procedures performed  Clinical Data: No additional findings.  ROS:  All other systems  negative, except as noted in the HPI. Review of Systems  Objective: Vital Signs: There were no vitals taken for this visit.  Specialty Comments:  No specialty comments available.  PMFS History: Patient Active Problem List   Diagnosis Date Noted  . 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 Closed trimalleolar fracture of right ankle 12/07/2016  . Pain in left ankle and joints of left foot 12/07/2016   No past medical history on file.  No family history on file.  No past surgical history on file. Social History   Occupational History  . Not on file.   Social History Main Topics  . Smoking status: Never Smoker  . Smokeless tobacco: Never Used  . Alcohol use Not on file  . Drug use: No  . Sexual activity: Not on file

## 2018-02-12 ENCOUNTER — Ambulatory Visit (INDEPENDENT_AMBULATORY_CARE_PROVIDER_SITE_OTHER): Payer: Medicare Other | Admitting: Obstetrics & Gynecology

## 2018-02-12 ENCOUNTER — Encounter: Payer: Self-pay | Admitting: Obstetrics & Gynecology

## 2018-02-12 VITALS — BP 126/74 | Ht 62.5 in | Wt 171.8 lb

## 2018-02-12 DIAGNOSIS — Z124 Encounter for screening for malignant neoplasm of cervix: Secondary | ICD-10-CM | POA: Diagnosis not present

## 2018-02-12 DIAGNOSIS — E6609 Other obesity due to excess calories: Secondary | ICD-10-CM

## 2018-02-12 DIAGNOSIS — Z01419 Encounter for gynecological examination (general) (routine) without abnormal findings: Secondary | ICD-10-CM | POA: Diagnosis not present

## 2018-02-12 DIAGNOSIS — Z780 Asymptomatic menopausal state: Secondary | ICD-10-CM

## 2018-02-12 DIAGNOSIS — N952 Postmenopausal atrophic vaginitis: Secondary | ICD-10-CM | POA: Diagnosis not present

## 2018-02-12 DIAGNOSIS — Z6830 Body mass index (BMI) 30.0-30.9, adult: Secondary | ICD-10-CM

## 2018-02-12 DIAGNOSIS — Z13820 Encounter for screening for osteoporosis: Secondary | ICD-10-CM | POA: Diagnosis not present

## 2018-02-12 NOTE — Progress Notes (Signed)
Anna Sparks [birth year] [id number]   History:    67 y.o. [id number] Married.  1 grand-son, second grand-son coming in 06/2018.  RP:  Established patient presenting for annual gyn exam   HPI: Menopause, well on no HRT.  No PMB.  No pelvic pain.  Dryness and pain with IC, would like recommendations on lubricant.  Urine, BMs wnl.  Breasts wnl.  Broke her Rt ankle after a fall while traveling in [place].  No recent Bone Density.  Took Vit D supplement after the fracture, no recent level done.  BMI 30.92.  Health labs with Fam MD.  Past medical history,surgical history, family history and social history were all reviewed and documented in the EPIC chart.  Gynecologic History No LMP recorded. Patient is postmenopausal. Contraception: post menopausal status Last Pap: 2016. Results were: Negative/HPV HR neg Last mammogram: 02/06/2018. Results were: Negative Bone Density: 05/2008 Colonoscopy: 2013-14.  5 yr schedule, will schedule now.  Obstetric History OB History  Gravida Para Term Preterm AB Living  4 2     2 2   SAB TAB Ectopic Multiple Live Births  2            # Outcome Date GA Lbr Len/2nd Weight Sex Delivery Anes PTL Lv  4 SAB           3 SAB           2 Para           1 Para              ROS: A ROS was performed and pertinent positives and negatives are included in the history.  GENERAL: No fevers or chills. HEENT: No change in vision, no earache, sore throat or sinus congestion. NECK: No pain or stiffness. CARDIOVASCULAR: No chest pain or pressure. No palpitations. PULMONARY: No shortness of breath, cough or wheeze. GASTROINTESTINAL: No abdominal pain, nausea, vomiting or diarrhea, melena or bright red blood per rectum. GENITOURINARY: No urinary frequency, urgency, hesitancy or dysuria. MUSCULOSKELETAL: No joint or muscle pain, no back pain, no recent trauma. DERMATOLOGIC: No rash, no itching, no lesions. ENDOCRINE: No polyuria, polydipsia, no heat or cold intolerance. No recent  change in weight. HEMATOLOGICAL: No anemia or easy bruising or bleeding. NEUROLOGIC: No headache, seizures, numbness, tingling or weakness. PSYCHIATRIC: No depression, no loss of interest in normal activity or change in sleep pattern.     Exam:   BP 126/74   Ht 5' 2.5" (1.588 m)   Wt 171 lb 12.8 oz (77.9 kg)   BMI 30.92 kg/m   Body mass index is 30.92 kg/m.  General appearance : Well developed well nourished female. No acute distress HEENT: Eyes: no retinal hemorrhage or exudates,  Neck supple, trachea midline, no carotid bruits, no thyroidmegaly Lungs: Clear to auscultation, no rhonchi or wheezes, or rib retractions  Heart: Regular rate and rhythm, no murmurs or gallops Breast:Examined in sitting and supine position were symmetrical in appearance, no palpable masses or tenderness,  no skin retraction, no nipple inversion, no nipple discharge, no skin discoloration, no axillary or supraclavicular lymphadenopathy Abdomen: no palpable masses or tenderness, no rebound or guarding Extremities: no edema or skin discoloration or tenderness  Pelvic: Vulva: Normal             Vagina: No gross lesions or discharge  Cervix: No gross lesions or discharge.  Pap reflex done.  Uterus  AV, normal size, shape and consistency, non-tender and mobile  Adnexa  Without masses  or tenderness  Anus: Normal   Assessment/Plan:  67 y.o. female for annual exam   1. Encounter for routine gynecological examination with Papanicolaou smear of cervix Normal gynecologic exam and menopause.  Pap reflex done.  Breast exam normal.  Screening mammogram negative in August 2019.  Colonoscopy to schedule now.  Health labs with family physician.  2. Menopause present Well on no hormone replacement therapy.  No postmenopausal bleeding.  3. Post-menopause atrophic vaginitis Postmenopausal atrophic vaginitis with dryness during intercourse.  Recommend Astroglide lubricant.  May also want to try Coconut oil.  Replens  moisturizer if experiencing dryness outside sexual activity.  4. Screening for osteoporosis Recommend vitamin D supplements per level of vitamin D drawn today, calcium intake 1.2 to 1.5 g/day including nutritional and supplemental calcium, regular weightbearing physical activity.  Patient will follow-up for a bone density here. - DG Bone Density; Future - VITAMIN D 25 Hydroxy (Vit-D Deficiency, Fractures)  5. Class 1 obesity due to excess calories without serious comorbidity with body mass index (BMI) of 30.0 to 30.9 in adult Body mass index at 30.92.  Recommend a low calorie/carb diet such as [employer] diet and physical activity with aerobic activities 5 times a week and weightlifting every 2 days.  Counseling on above issues and coordination of care more than 50% for 10 minutes.  Genia DelMarie-Lyne Bhavya Grand MD, 2:42 PM 02/12/2018

## 2018-02-13 LAB — PAP IG W/ RFLX HPV ASCU

## 2018-02-13 LAB — VITAMIN D 25 HYDROXY (VIT D DEFICIENCY, FRACTURES): VIT D 25 HYDROXY: 14 ng/mL — AB (ref 30–100)

## 2018-02-14 ENCOUNTER — Encounter: Payer: Self-pay | Admitting: Obstetrics & Gynecology

## 2018-02-14 NOTE — Patient Instructions (Addendum)
1. Encounter for routine gynecological examination with Papanicolaou smear of cervix Normal gynecologic exam and menopause.  Pap reflex done.  Breast exam normal.  Screening mammogram negative in August 2019.  Colonoscopy to schedule now.  Health labs with family physician.  2. Menopause present Well on no hormone replacement therapy.  No postmenopausal bleeding.  3. Post-menopause atrophic vaginitis Postmenopausal atrophic vaginitis with dryness during intercourse.  Recommend Astroglide lubricant.  May also want to try Coconut oil.  Replens moisturizer if experiencing dryness outside sexual activity.  4. Screening for osteoporosis Recommend vitamin D supplements per level of vitamin D drawn today, calcium intake 1.2 to 1.5 g/day including nutritional and supplemental calcium, regular weightbearing physical activity.  Patient will follow-up for a bone density here. - DG Bone Density; Future - VITAMIN D 25 Hydroxy (Vit-D Deficiency, Fractures)  5. Class 1 obesity due to excess calories without serious comorbidity with body mass index (BMI) of 30.0 to 30.9 in adult Body mass index at 30.92.  Recommend a low calorie/carb diet such as [employer] diet and physical activity with aerobic activities 5 times a week and weightlifting every 2 days.  Anna Sparks, it was a pleasure seeing you today!  I will inform you of your results as soon as they are available.

## 2018-02-15 ENCOUNTER — Other Ambulatory Visit: Payer: Self-pay | Admitting: Obstetrics & Gynecology

## 2018-02-15 DIAGNOSIS — E559 Vitamin D deficiency, unspecified: Secondary | ICD-10-CM

## 2018-02-15 MED ORDER — VITAMIN D (ERGOCALCIFEROL) 1.25 MG (50000 UNIT) PO CAPS: 50000.0000 [IU] | ORAL_CAPSULE | ORAL | 0 refills | Status: DC

## 2018-02-15 NOTE — Progress Notes (Signed)
vit

## 2018-02-15 NOTE — Addendum Note (Signed)
Addended by: Tito DineBONHAM, KIM A on: 02/15/2018 09:50 AM   Modules accepted: Orders

## 2018-02-27 ENCOUNTER — Other Ambulatory Visit: Payer: Self-pay | Admitting: Gynecology

## 2018-02-27 DIAGNOSIS — Z780 Asymptomatic menopausal state: Secondary | ICD-10-CM

## 2018-02-27 DIAGNOSIS — Z13820 Encounter for screening for osteoporosis: Secondary | ICD-10-CM

## 2018-03-03 DIAGNOSIS — M8580 Other specified disorders of bone density and structure, unspecified site: Secondary | ICD-10-CM

## 2018-03-03 HISTORY — DX: Other specified disorders of bone density and structure, unspecified site: M85.80

## 2018-03-06 ENCOUNTER — Other Ambulatory Visit: Payer: Self-pay | Admitting: Obstetrics & Gynecology

## 2018-03-06 DIAGNOSIS — Z780 Asymptomatic menopausal state: Secondary | ICD-10-CM

## 2018-03-11 ENCOUNTER — Ambulatory Visit (INDEPENDENT_AMBULATORY_CARE_PROVIDER_SITE_OTHER): Payer: Medicare Other

## 2018-03-11 DIAGNOSIS — Z780 Asymptomatic menopausal state: Secondary | ICD-10-CM | POA: Diagnosis not present

## 2018-03-11 DIAGNOSIS — M8589 Other specified disorders of bone density and structure, multiple sites: Secondary | ICD-10-CM | POA: Diagnosis not present

## 2018-03-12 ENCOUNTER — Other Ambulatory Visit: Payer: Self-pay | Admitting: Gynecology

## 2018-03-12 ENCOUNTER — Encounter: Payer: Self-pay | Admitting: Gynecology

## 2018-03-12 DIAGNOSIS — Z780 Asymptomatic menopausal state: Secondary | ICD-10-CM

## 2018-03-12 DIAGNOSIS — M8589 Other specified disorders of bone density and structure, multiple sites: Secondary | ICD-10-CM

## 2018-03-15 ENCOUNTER — Telehealth: Payer: Self-pay | Admitting: Vascular Surgery

## 2018-03-15 NOTE — Telephone Encounter (Signed)
sch appt lvm mld ltr 05/15/18 9am venous reflux 10am New VV

## 2018-03-18 ENCOUNTER — Other Ambulatory Visit: Payer: Self-pay

## 2018-03-18 DIAGNOSIS — I83893 Varicose veins of bilateral lower extremities with other complications: Secondary | ICD-10-CM

## 2018-05-07 ENCOUNTER — Other Ambulatory Visit: Payer: Medicare Other

## 2018-05-07 DIAGNOSIS — E559 Vitamin D deficiency, unspecified: Secondary | ICD-10-CM

## 2018-05-08 ENCOUNTER — Encounter (HOSPITAL_COMMUNITY): Payer: Self-pay

## 2018-05-08 LAB — VITAMIN D 25 HYDROXY (VIT D DEFICIENCY, FRACTURES): VIT D 25 HYDROXY: 17 ng/mL — AB (ref 30–100)

## 2018-05-09 ENCOUNTER — Other Ambulatory Visit: Payer: Self-pay | Admitting: Obstetrics & Gynecology

## 2018-05-09 DIAGNOSIS — E559 Vitamin D deficiency, unspecified: Secondary | ICD-10-CM

## 2018-05-09 MED ORDER — VITAMIN D (ERGOCALCIFEROL) 1.25 MG (50000 UNIT) PO CAPS: 50000.0000 [IU] | ORAL_CAPSULE | ORAL | 0 refills | Status: DC

## 2018-05-15 ENCOUNTER — Encounter: Payer: Self-pay | Admitting: Vascular Surgery

## 2018-05-15 ENCOUNTER — Ambulatory Visit (INDEPENDENT_AMBULATORY_CARE_PROVIDER_SITE_OTHER): Payer: Medicare Other | Admitting: Vascular Surgery

## 2018-05-15 ENCOUNTER — Ambulatory Visit (HOSPITAL_COMMUNITY)
Admission: RE | Admit: 2018-05-15 | Discharge: 2018-05-15 | Disposition: A | Discharge status: Home / Self Care | Payer: Medicare Other | Source: Ambulatory Visit | Attending: Vascular Surgery | Admitting: Vascular Surgery

## 2018-05-15 VITALS — BP 117/71 | HR 70 | Temp 96.9°F | Resp 16 | Ht 64.0 in | Wt 170.0 lb

## 2018-05-15 DIAGNOSIS — I83893 Varicose veins of bilateral lower extremities with other complications: Secondary | ICD-10-CM | POA: Insufficient documentation

## 2018-05-15 NOTE — Progress Notes (Signed)
REASON FOR CONSULT:    Bilateral lower extremity varicose veins.  Patient is a self-referral.  HPI:   Anna Sparks is a pleasant 67 y.o. female, who presents for evaluation of painful varicose veins of both lower extremities.  Her symptoms are more significant on the right side.  She is undergone previous stripping of the left great saphenous vein in the past by 1 of the general surgeons.  More recently in 2010 she underwent endovenous laser ablation of the right great saphenous vein by Dr. Hart RochesterLawson with stab phlebectomies.  She has developed recurrent varicose veins of both lower extremities over the last 8 to 10 years.  These have gradually progressed.  She describes significant heaviness in both legs which is aggravated by standing and sitting and relieved somewhat with elevation.  She has been wearing her knee-high compression stockings with a gradient of 20 to 30 mmHg with some relief.  She takes ibuprofen as needed for pain.  Her symptoms have become more disabling.  She is unaware of any previous history of DVT or phlebitis.  Past Medical History:  Diagnosis Date  . Osteopenia 03/2018   T score -1.9 FRAX 15% / 1.7%    Family History  Problem Relation Age of Onset  . Cancer Mother        colon  . Hypertension Mother   . Hypertension Father   . Hypertension Sister   . Breast cancer Maternal Aunt     SOCIAL HISTORY: Social History   Socioeconomic History  . Marital status: Married    Spouse name: Not on file  . Number of children: Not on file  . Years of education: Not on file  . Highest education level: Not on file  Occupational History  . Not on file  Social Needs  . Financial resource strain: Not on file  . Food insecurity:    Worry: Not on file    Inability: Not on file  . Transportation needs:    Medical: Not on file    Non-medical: Not on file  Tobacco Use  . Smoking status: Never Smoker  . Smokeless tobacco: Never Used  Substance and Sexual Activity  .  Alcohol use: Yes    Comment: 7 GLASSES OF WINE A WEEK   . Drug use: No  . Sexual activity: Yes    Partners: Male    Comment: 1st intercourse- [age], partners- 1, married- 48 yrs   Lifestyle  . Physical activity:    Days per week: Not on file    Minutes per session: Not on file  . Stress: Not on file  Relationships  . Social connections:    Talks on phone: Not on file    Gets together: Not on file    Attends religious service: Not on file    Active member of club or organization: Not on file    Attends meetings of clubs or organizations: Not on file    Relationship status: Not on file  . Intimate partner violence:    Fear of current or ex partner: Not on file    Emotionally abused: Not on file    Physically abused: Not on file    Forced sexual activity: Not on file  Other Topics Concern  . Not on file  Social History Narrative  . Not on file    No Known Allergies  Current Outpatient Medications  Medication Sig Dispense Refill  . aspirin 81 MG chewable tablet Chew 81 mg by mouth daily.    .Marland Kitchen  lisinopril-hydrochlorothiazide (PRINZIDE,ZESTORETIC) 10-12.5 MG tablet Take 10 tablets by mouth daily.    . metoprolol succinate (TOPROL-XL) 50 MG 24 hr tablet Take 50 mg by mouth daily.    . Vitamin D, Ergocalciferol, (DRISDOL) 1.25 MG (50000 UT) CAPS capsule Take 1 capsule (50,000 Units total) by mouth every 7 (seven) days. 12 capsule 0   No current facility-administered medications for this visit.     REVIEW OF SYSTEMS:  [X]  denotes positive finding, [ ]  denotes negative finding Cardiac  Comments:  Chest pain or chest pressure:    Shortness of breath upon exertion:    Short of breath when lying flat:    Irregular heart rhythm: x       Vascular    Pain in calf, thigh, or hip brought on by ambulation:    Pain in feet at night that wakes you up from your sleep:     Blood clot in your veins:    Leg swelling:         Pulmonary    Oxygen at home:    Productive cough:       Wheezing:         Neurologic    Sudden weakness in arms or legs:     Sudden numbness in arms or legs:     Sudden onset of difficulty speaking or slurred speech:    Temporary loss of vision in one eye:     Problems with dizziness:         Gastrointestinal    Blood in stool:     Vomited blood:         Genitourinary    Burning when urinating:     Blood in urine:        Psychiatric    Major depression:         Hematologic    Bleeding problems:    Problems with blood clotting too easily:        Skin    Rashes or ulcers:        Constitutional    Fever or chills:     PHYSICAL EXAM:   Vitals:   05/15/18 0951  BP: 117/71  Pulse: 70  Resp: 16  Temp: (!) 96.9 F (36.1 C)  SpO2: 99%  Weight: 170 lb (77.1 kg)  Height: 5\' 4"  (1.626 m)    GENERAL: The patient is a well-nourished female, in no acute distress. The vital signs are documented above. CARDIAC: There is a regular rate and rhythm.  VASCULAR: I do not detect carotid bruits. She has palpable dorsalis pedis pulses bilaterally. VENOUS EXAM: She has significantly enlarged varicose veins along the anterior medial aspect of her right thigh which extend along the medial aspect of her right leg.  On the left side she has some varicose veins on the anterior left leg and also on the left calf.  In addition she has spider veins bilaterally. PULMONARY: There is good air exchange bilaterally without wheezing or rales. ABDOMEN: Soft and non-tender with normal pitched bowel sounds.  MUSCULOSKELETAL: There are no major deformities or cyanosis. NEUROLOGIC: No focal weakness or paresthesias are detected. SKIN: There are no ulcers or rashes noted. PSYCHIATRIC: The patient has a normal affect.  DATA:    VENOUS DUPLEX: I have independently interpreted her venous duplex scan today.  On the right side there is deep venous reflux involving the common femoral vein and popliteal vein.  There is some reflux at the saphenofemoral junction.   The saphenous vein  on the right has been ablated.  There is no reflux in the small saphenous vein.  On the left side there is deep venous reflux involving the common femoral vein femoral vein.  There is also reflux at the saphenofemoral junction.  The left great saphenous vein has previously been removed surgically.  There is no reflux of the small saphenous vein.  She has no DVT on either side.   ASSESSMENT & PLAN:   PAINFUL VARICOSE VEINS BILATERAL LOWER EXTREMITIES: This patient has painful varicose veins of both lower extremities but more significantly on the right side.  She is failed conservative treatment with thigh-high compression stockings and a gradient of 20 to 30 mmHg.  Leg elevation, and ibuprofen as needed for pain.  I think she would be a good candidate for 10-20 stab phlebectomies of the right lower extremity and sclerotherapy to help alleviate her symptoms.  I have discussed with her the indications of the procedure and potential complications and she would like to proceed as soon as possible.   Waverly Ferrari [facility] [phone]

## 2018-05-23 ENCOUNTER — Encounter: Payer: Self-pay | Admitting: Vascular Surgery

## 2018-05-23 ENCOUNTER — Ambulatory Visit (INDEPENDENT_AMBULATORY_CARE_PROVIDER_SITE_OTHER): Payer: Medicare Other | Admitting: Vascular Surgery

## 2018-05-23 VITALS — BP 123/74 | HR 75 | Temp 97.7°F | Resp 16 | Ht 64.0 in | Wt 168.0 lb

## 2018-05-23 DIAGNOSIS — I83893 Varicose veins of bilateral lower extremities with other complications: Secondary | ICD-10-CM | POA: Diagnosis not present

## 2018-05-23 DIAGNOSIS — I868 Varicose veins of other specified sites: Secondary | ICD-10-CM

## 2018-05-23 NOTE — Progress Notes (Signed)
   Patient name: Anna Sparks Tarnowski MRN: [id number] DOB: [birth year] Sex: female  REASON FOR VISIT:   For stab phlebectomies.  HPI:   Anna Sparks Harries is a pleasant 67 y.o. female who I last saw on 05/15/2018.  She had painful varicose veins of both lower extremities but more significantly on the right side.  She had failed conservative treatment and I felt she was a good candidate for 10-20 stab phlebectomies and sclerotherapy of the right lower extremity.  Current Outpatient Medications  Medication Sig Dispense Refill  . aspirin 81 MG chewable tablet Chew 81 mg by mouth daily.    Marland Kitchen. lisinopril-hydrochlorothiazide (PRINZIDE,ZESTORETIC) 10-12.5 MG tablet Take 10 tablets by mouth daily.    . metoprolol succinate (TOPROL-XL) 50 MG 24 hr tablet Take 50 mg by mouth daily.    . Vitamin D, Ergocalciferol, (DRISDOL) 1.25 MG (50000 UT) CAPS capsule Take 1 capsule (50,000 Units total) by mouth every 7 (seven) days. 12 capsule 0   No current facility-administered medications for this visit.     REVIEW OF SYSTEMS:  [X]  denotes positive finding, [ ]  denotes negative finding Vascular    Leg swelling    Cardiac    Chest pain or chest pressure:    Shortness of breath upon exertion:    Short of breath when lying flat:    Irregular heart rhythm:    Constitutional    Fever or chills:     PHYSICAL EXAM:   Vitals:   05/23/18 0845  BP: 123/74  Pulse: 75  Resp: 16  Temp: 97.7 F (36.5 C)  SpO2: 98%  Weight: 168 lb (76.2 kg)  Height: 5\' 4"  (1.626 m)    GENERAL: The patient is a well-nourished female, in no acute distress. The vital signs are documented above.  DATA:   No new data  MEDICAL ISSUES:   10-20 STAB PHLEBECTOMIES RIGHT LOWER EXTREMITY: The patient was taken to the exam room and the dilated veins in the right leg were marked with the patient standing.  The patient was then placed supine.  Right leg was prepped and draped in usual sterile fashion.  The skin was anesthetized with 1%  lidocaine and then all the marked areas were anesthetized with tumescent anesthesia.  Using approximately 20 small stab incisions with an 11 blade the veins were teased into the wound and graft with a hemostat and then gently removed and pressure held for hemostasis.  Sterile dressing was applied.  Patient tolerated procedure well.  Patient is scheduled for sclerotherapy and will follow-up with Marisue IvanLiz.   Waverly Ferrarihristopher Amri Lien [facility] [phone]

## 2018-05-23 NOTE — Progress Notes (Signed)
    Stab Phlebectomy Procedure  Anna Sparks DOB:Jul 12, 1950  05/23/2018  Consent signed: Yes  Surgeon:C. Edilia Boickson, MD  Procedure: stab phlebectomy: right leg  BP 123/74   Pulse 75   Temp 97.7 F (36.5 C)   Resp 16   Ht 5\' 4"  (1.626 m)   Wt 168 lb (76.2 kg)   SpO2 98%   BMI 28.84 kg/m   Start time: 9   End time: 9:50   Tumescent Anesthesia: 300 cc 0.9% NaCl with 50 cc Lidocaine HCL with 1% Epi and 15 cc 8.4% NaHCO3  Local Anesthesia: 5 cc Lidocaine HCL and NaHCO3 (ratio 2:1)    Stab Phlebectomy: 10-20 Sites: Thigh and Calf  Patient tolerated procedure well: Yes  Notes:   Description of Procedure:  After marking the course of the secondary varicosities, the patient was placed on the operating table in the supine position, and the right leg was prepped and draped in sterile fashion.    The patient was then put into Trendelenburg position.  Local anesthetic was administered at the previously marked varicosities, and tumescent anesthesia was administered around the vessels.  Ten to 20 stab wounds were made using the tip of an 11 blade. And using the vein hook, the phlebectomies were performed using a hemostat to avulse the varicosities.  Adequate hemostasis was achieved, and steri strips were applied to the stab wound.      ABD pads and thigh high compression stockings were applied as well ace wraps where needed. Blood loss was less than 15 cc.  The patient ambulated out of the operating room having tolerated the procedure well.

## 2018-05-24 ENCOUNTER — Encounter: Payer: Self-pay | Admitting: Vascular Surgery

## 2018-07-02 ENCOUNTER — Ambulatory Visit (INDEPENDENT_AMBULATORY_CARE_PROVIDER_SITE_OTHER): Payer: Medicare Other | Admitting: *Deleted

## 2018-07-02 ENCOUNTER — Encounter: Payer: Self-pay | Admitting: *Deleted

## 2018-07-02 ENCOUNTER — Ambulatory Visit: Payer: Medicare Other | Admitting: *Deleted

## 2018-07-02 DIAGNOSIS — I83893 Varicose veins of bilateral lower extremities with other complications: Secondary | ICD-10-CM | POA: Diagnosis not present

## 2018-07-02 NOTE — Progress Notes (Signed)
X=.3% Sotradecol administered with a 27g butterfly.  Patient received a total of 12cc.  Treated a combo of reticulars and spiders. Easy access. Tol well. Anticipate good results. Follow prn.                  Photos: Yes.    Compression stockings applied: Yes.

## 2018-08-01 ENCOUNTER — Other Ambulatory Visit: Payer: Medicare Other

## 2018-08-01 DIAGNOSIS — E559 Vitamin D deficiency, unspecified: Secondary | ICD-10-CM

## 2018-08-02 LAB — VITAMIN D 25 HYDROXY (VIT D DEFICIENCY, FRACTURES): VIT D 25 HYDROXY: 18 ng/mL — AB (ref 30–100)

## 2018-08-12 ENCOUNTER — Telehealth: Payer: Self-pay

## 2018-08-12 NOTE — Telephone Encounter (Signed)
Patient called asking if I could look back at her Vit D level results from 10 years ago and advise what it was, what was done, etc. I let her know that I do not have any blood work results from 10 years available in Epic.

## 2019-02-17 ENCOUNTER — Encounter: Payer: Medicare Other | Admitting: Obstetrics & Gynecology

## 2019-04-09 ENCOUNTER — Encounter: Payer: Self-pay | Admitting: Gynecology

## 2019-05-21 ENCOUNTER — Other Ambulatory Visit: Payer: Self-pay

## 2019-05-22 ENCOUNTER — Encounter: Payer: Medicare Other | Admitting: Obstetrics & Gynecology

## 2019-06-18 ENCOUNTER — Other Ambulatory Visit: Payer: Self-pay

## 2019-06-19 ENCOUNTER — Encounter: Payer: Self-pay | Admitting: Obstetrics & Gynecology

## 2019-06-19 ENCOUNTER — Ambulatory Visit (INDEPENDENT_AMBULATORY_CARE_PROVIDER_SITE_OTHER): Payer: Medicare Other | Admitting: Obstetrics & Gynecology

## 2019-06-19 VITALS — BP 124/78 | Ht 62.0 in | Wt 182.0 lb

## 2019-06-19 DIAGNOSIS — E6609 Other obesity due to excess calories: Secondary | ICD-10-CM

## 2019-06-19 DIAGNOSIS — Z780 Asymptomatic menopausal state: Secondary | ICD-10-CM

## 2019-06-19 DIAGNOSIS — M8589 Other specified disorders of bone density and structure, multiple sites: Secondary | ICD-10-CM

## 2019-06-19 DIAGNOSIS — Z01419 Encounter for gynecological examination (general) (routine) without abnormal findings: Secondary | ICD-10-CM | POA: Diagnosis not present

## 2019-06-19 DIAGNOSIS — Z6833 Body mass index (BMI) 33.0-33.9, adult: Secondary | ICD-10-CM

## 2019-06-19 NOTE — Patient Instructions (Signed)
1. Well female exam with routine gynecological exam Normal gynecologic exam in menopause.  Pap test August 2019 was negative, no indication to repeat this year.  Breast exam normal.  Will schedule screening mammogram.  Health labs with family physician.  Colonoscopy in 2020.  2. Postmenopause Well on no hormone replacement therapy.  No postmenopausal bleeding.  3. Osteopenia of multiple sites Osteopenia on bone density September 2019.  Will repeat in September 2021.  Vitamin D supplements, calcium intake of 1200 mg daily and regular weightbearing physical activity is recommended.  4. Class 1 obesity due to excess calories without serious comorbidity with body mass index (BMI) of 33.0 to 33.9 in adult Recommend a lower calorie/carb diet such as [employer].  Aerobic physical activities 5 times a week and light weightlifting every 2 days.  Other orders - Multiple Vitamins-Minerals (ICAPS AREDS 2 PO); Take 1 tablet by mouth daily. - Vitamin D, Cholecalciferol, 25 MCG (1000 UT) TABS; Take 1 tablet by mouth daily. - Omega-3 1000 MG CAPS; Take 1 tablet by mouth daily. - CALCIUM PO; Take 1 tablet by mouth daily.  Arelly, it was a pleasure seeing you today!

## 2019-06-19 NOTE — Progress Notes (Signed)
Anna Sparks December 06, 1950 [id number]   History:    68 y.o. G59P2A2L2 Married.  2 grand-sons  RP:  Established patient presenting for annual gyn exam   HPI: Menopause, well on no HRT.  No PMB.  No pelvic pain.  Dryness and pain with IC, recommend coconut oil.  Urine, BMs wnl.  Breasts wnl.  H/O ankle fracture.  Bone Density 03/2018 Osteopenia.  BMI increased to 33.29  Health labs with Fam MD.   Past medical history,surgical history, family history and social history were all reviewed and documented in the EPIC chart.  Gynecologic History No LMP recorded. Patient is postmenopausal.  Obstetric History OB History  Gravida Para Term Preterm AB Living  4 2     2 2   SAB TAB Ectopic Multiple Live Births  2            # Outcome Date GA Lbr Len/2nd Weight Sex Delivery Anes PTL Lv  4 SAB           3 SAB           2 Para           1 Para              ROS: A ROS was performed and pertinent positives and negatives are included in the history.  GENERAL: No fevers or chills. HEENT: No change in vision, no earache, sore throat or sinus congestion. NECK: No pain or stiffness. CARDIOVASCULAR: No chest pain or pressure. No palpitations. PULMONARY: No shortness of breath, cough or wheeze. GASTROINTESTINAL: No abdominal pain, nausea, vomiting or diarrhea, melena or bright red blood per rectum. GENITOURINARY: No urinary frequency, urgency, hesitancy or dysuria. MUSCULOSKELETAL: No joint or muscle pain, no back pain, no recent trauma. DERMATOLOGIC: No rash, no itching, no lesions. ENDOCRINE: No polyuria, polydipsia, no heat or cold intolerance. No recent change in weight. HEMATOLOGICAL: No anemia or easy bruising or bleeding. NEUROLOGIC: No headache, seizures, numbness, tingling or weakness. PSYCHIATRIC: No depression, no loss of interest in normal activity or change in sleep pattern.     Exam:   BP 124/78 (BP Location: Right Arm, Patient Position: Sitting, Cuff Size: Normal)   Ht 5\' 2"  (1.575 m)    Wt 182 lb (82.6 kg)   BMI 33.29 kg/m   Body mass index is 33.29 kg/m.  General appearance : Well developed well nourished female. No acute distress HEENT: Eyes: no retinal hemorrhage or exudates,  Neck supple, trachea midline, no carotid bruits, no thyroidmegaly Lungs: Clear to auscultation, no rhonchi or wheezes, or rib retractions  Heart: Regular rate and rhythm, no murmurs or gallops Breast:Examined in sitting and supine position were symmetrical in appearance, no palpable masses or tenderness,  no skin retraction, no nipple inversion, no nipple discharge, no skin discoloration, no axillary or supraclavicular lymphadenopathy Abdomen: no palpable masses or tenderness, no rebound or guarding Extremities: no edema or skin discoloration or tenderness  Pelvic: Vulva: Normal             Vagina: No gross lesions or discharge  Cervix: No gross lesions or discharge  Uterus  AV, normal size, shape and consistency, non-tender and mobile  Adnexa  Without masses or tenderness  Anus: Normal   Assessment/Plan:  68 y.o. female for annual exam   1. Well female exam with routine gynecological exam Normal gynecologic exam in menopause.  Pap test August 2019 was negative, no indication to repeat this year.  Breast exam normal.  Will schedule  screening mammogram when due.  Health labs with family physician.  Colonoscopy in 2020.  2. Postmenopause Well on no hormone replacement therapy.  No postmenopausal bleeding.  3. Osteopenia of multiple sites Osteopenia on bone density September 2019.  Will repeat in September 2021.  Vitamin D supplements, calcium intake of 1200 mg daily and regular weightbearing physical activity is recommended.  4. Class 1 obesity due to excess calories without serious comorbidity with body mass index (BMI) of 33.0 to 33.9 in adult Recommend a lower calorie/carb diet such as [employer].  Aerobic physical activities 5 times a week and light weightlifting every 2  days.  Other orders - Multiple Vitamins-Minerals (ICAPS AREDS 2 PO); Take 1 tablet by mouth daily. - Vitamin D, Cholecalciferol, 25 MCG (1000 UT) TABS; Take 1 tablet by mouth daily. - Omega-3 1000 MG CAPS; Take 1 tablet by mouth daily. - CALCIUM PO; Take 1 tablet by mouth daily.  Genia Del MD, 2:45 PM 06/19/2019

## 2019-12-09 ENCOUNTER — Ambulatory Visit (INDEPENDENT_AMBULATORY_CARE_PROVIDER_SITE_OTHER): Payer: Medicare Other

## 2019-12-09 ENCOUNTER — Ambulatory Visit (INDEPENDENT_AMBULATORY_CARE_PROVIDER_SITE_OTHER): Payer: Medicare Other | Admitting: Orthopedic Surgery

## 2019-12-09 ENCOUNTER — Other Ambulatory Visit: Payer: Self-pay

## 2019-12-09 ENCOUNTER — Encounter: Payer: Self-pay | Admitting: Family

## 2019-12-09 VITALS — Ht 62.0 in | Wt 182.0 lb

## 2019-12-09 DIAGNOSIS — M76821 Posterior tibial tendinitis, right leg: Secondary | ICD-10-CM

## 2019-12-09 DIAGNOSIS — M25571 Pain in right ankle and joints of right foot: Secondary | ICD-10-CM | POA: Diagnosis not present

## 2019-12-11 ENCOUNTER — Encounter: Payer: Self-pay | Admitting: Orthopedic Surgery

## 2019-12-11 NOTE — Progress Notes (Signed)
Office Visit Note   Patient: Anna Sparks           Date of Birth: 1950-08-31           MRN: [id number] Visit Date: 12/09/2019              Requested by: Barbie Banner, MD [address] [place],  [place] [address] PCP: Barbie Banner, MD  Chief Complaint  Patient presents with  . Right Ankle - Pain    Hx ORIF bimal ankle fx in [place] 2018      HPI: Patient is a 69 year old woman who presents with pain in her right ankle that she states has been getting worse over the past 2 weeks she states her ankle looks puffy she is status post right ankle open reduction internal fixation for a bimalleolar fracture in [place] in 2018.  Assessment & Plan: Visit Diagnoses:  1. Pain in right ankle and joints of right foot     Plan: Plan: Recommended strengthening dorsiflexion stretching.  A over-the-counter orthotic with stiff soled sneakers to protect the posterior tibial tendon.  Follow-Up Instructions: Return if symptoms worsen or fail to improve.   Ortho Exam  Patient is alert, oriented, no adenopathy, well-dressed, normal affect, normal respiratory effort. Examination patient does have swelling over the posterior tibial tendon the ankle is nontender to palpation anteriorly.  She has dorsiflexion to neutral.  Patient can do a double limb heel raise but does have pain with a single limb heel raise on the right.  She has good ankle inversion with toe raise with no insufficiency of the posterior tibial tendon.  There is no tenderness to palpation over the posterior tibial tendon parent  Imaging: No results found. No images are attached to the encounter.  Labs: No results found for: HGBA1C, ESRSEDRATE, CRP, LABURIC, REPTSTATUS, GRAMSTAIN, CULT, LABORGA   No results found for: ALBUMIN, PREALBUMIN, LABURIC  No results found for: MG Lab Results  Component Value Date   VD25OH 18 (L) 08/01/2018   VD25OH 17 (L) 05/07/2018   VD25OH 14 (L) 02/12/2018    No results found for:  PREALBUMIN No flowsheet data found.   Body mass index is 33.29 kg/m.  Orders:  Orders Placed This Encounter  Procedures  . XR Ankle Complete Right   No orders of the defined types were placed in this encounter.    Procedures: No procedures performed  Clinical Data: No additional findings.  ROS:  All other systems negative, except as noted in the HPI. Review of Systems  Objective: Vital Signs: Ht 5\' 2"  (1.575 m)   Wt 182 lb (82.6 kg)   BMI 33.29 kg/m   Specialty Comments:  No specialty comments available.  PMFS History: Patient Active Problem List   Diagnosis Date Noted  . 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 Closed trimalleolar fracture of right ankle 12/07/2016  . Pain in left ankle and joints of left foot 12/07/2016   Past Medical History:  Diagnosis Date  . Osteopenia 03/2018   T score -1.9 FRAX 15% / 1.7%    Family History  Problem Relation Age of Onset  . Cancer Mother        colon  . Hypertension Mother   . Hypertension Father   . Hypertension Sister   . Breast cancer Maternal Aunt     Past Surgical History:  Procedure Laterality Date  . ANKLE SURGERY Right   . APPENDECTOMY    .  LGSV stripped in [place]  . RGSV laser ablation 2010 Right   . TUBAL LIGATION     Social History   Occupational History  . Not on file  Tobacco Use  . Smoking status: Never Smoker  . Smokeless tobacco: Never Used  Vaping Use  . Vaping Use: Never used  Substance and Sexual Activity  . Alcohol use: Yes    Comment: 7 GLASSES OF WINE A WEEK   . Drug use: No  . Sexual activity: Yes    Partners: Male    Comment: 1st intercourse- 46, partners- 45, married- 63 yrs

## 2020-06-21 ENCOUNTER — Encounter: Payer: Self-pay | Admitting: Obstetrics & Gynecology

## 2020-06-21 ENCOUNTER — Ambulatory Visit (INDEPENDENT_AMBULATORY_CARE_PROVIDER_SITE_OTHER): Payer: Medicare Other | Admitting: Obstetrics & Gynecology

## 2020-06-21 ENCOUNTER — Other Ambulatory Visit: Payer: Self-pay

## 2020-06-21 VITALS — BP 122/78 | Ht 63.25 in | Wt 180.0 lb

## 2020-06-21 DIAGNOSIS — Z780 Asymptomatic menopausal state: Secondary | ICD-10-CM

## 2020-06-21 DIAGNOSIS — Z01419 Encounter for gynecological examination (general) (routine) without abnormal findings: Secondary | ICD-10-CM

## 2020-06-21 DIAGNOSIS — E6609 Other obesity due to excess calories: Secondary | ICD-10-CM

## 2020-06-21 DIAGNOSIS — Z6831 Body mass index (BMI) 31.0-31.9, adult: Secondary | ICD-10-CM

## 2020-06-21 DIAGNOSIS — M8589 Other specified disorders of bone density and structure, multiple sites: Secondary | ICD-10-CM

## 2020-06-21 NOTE — Progress Notes (Signed)
Tanner Vigna Duffin [birth year] [id number]   History:    69 y.o. [id number] Married. 2 grand-sons  NT:[id number] presenting for annual gyn exam   GYK:[id number], well on no HRT. No PMB. No pelvic pain. Dryness and pain with IC, recommend coconut oil. Urine, BMs wnl. Breasts wnl. H/O ankle fracture.  Bone Density 03/2018 Osteopenia.  On Vit D supplements.  Walking. BMI decreased to 31.63 Health labs with Fam MD.  Alen Bleacher 2019.   Past medical history,surgical history, family history and social history were all reviewed and documented in the EPIC chart.  Gynecologic History No LMP recorded. Patient is postmenopausal.  Obstetric History OB History  Gravida Para Term Preterm AB Living  4 2     2 2   SAB IAB Ectopic Multiple Live Births  2            # Outcome Date GA Lbr Len/2nd Weight Sex Delivery Anes PTL Lv  4 SAB           3 SAB           2 Para           1 Para              ROS: A ROS was performed and pertinent positives and negatives are included in the history.  GENERAL: No fevers or chills. HEENT: No change in vision, no earache, sore throat or sinus congestion. NECK: No pain or stiffness. CARDIOVASCULAR: No chest pain or pressure. No palpitations. PULMONARY: No shortness of breath, cough or wheeze. GASTROINTESTINAL: No abdominal pain, nausea, vomiting or diarrhea, melena or bright red blood per rectum. GENITOURINARY: No urinary frequency, urgency, hesitancy or dysuria. MUSCULOSKELETAL: No joint or muscle pain, no back pain, no recent trauma. DERMATOLOGIC: No rash, no itching, no lesions. ENDOCRINE: No polyuria, polydipsia, no heat or cold intolerance. No recent change in weight. HEMATOLOGICAL: No anemia or easy bruising or bleeding. NEUROLOGIC: No headache, seizures, numbness, tingling or weakness. PSYCHIATRIC: No depression, no loss of interest in normal activity or change in sleep pattern.     Exam:   BP 122/78   Ht 5' 3.25" (1.607 m)   Wt 180 lb  (81.6 kg)   BMI 31.63 kg/m   Body mass index is 31.63 kg/m.  General appearance : Well developed well nourished female. No acute distress HEENT: Eyes: no retinal hemorrhage or exudates,  Neck supple, trachea midline, no carotid bruits, no thyroidmegaly Lungs: Clear to auscultation, no rhonchi or wheezes, or rib retractions  Heart: Regular rate and rhythm, no murmurs or gallops Breast:Examined in sitting and supine position were symmetrical in appearance, no palpable masses or tenderness,  no skin retraction, no nipple inversion, no nipple discharge, no skin discoloration, no axillary or supraclavicular lymphadenopathy Abdomen: no palpable masses or tenderness, no rebound or guarding Extremities: no edema or skin discoloration or tenderness  Pelvic: Vulva: Normal             Vagina: No gross lesions or discharge  Cervix: No gross lesions or discharge.  Pap reflex done.  Uterus  AV, normal size, shape and consistency, non-tender and mobile  Adnexa  Without masses or tenderness  Anus: Normal   Assessment/Plan:  69 y.o. female for annual exam   1. Encounter for routine gynecological examination with Papanicolaou smear of cervix Normal gynecologic exam in menopause.  Pap reflex done.  Breast exam normal.  Screening mammogram scheduled for January 2022.  Colonoscopy 2019.  Health labs with family physician.  2.  Postmenopause Well on no hormone replacement therapy.  No postmenopausal bleeding.  3. Osteopenia of multiple sites Osteopenia on bone density September 2019.  We will schedule a bone density here now.  Taking vitamin D supplements, recheck vitamin D level today.  Calcium 1500 mg daily as total intake.  Regular weightbearing physical activity is recommended. - VITAMIN D 25 Hydroxy (Vit-D Deficiency, Fractures) - DG Bone Density; Future  4. Class 1 obesity due to excess calories with serious comorbidity and body mass index (BMI) of 31.0 to 31.9 in adult Recommend a low  calorie/carb diet.  Intermittent fasting discussed.  Aerobic activities 5 times a week and light weightlifting every 2 days.  Other orders - doxycycline (PERIOSTAT) 20 MG tablet; Take 20 mg by mouth 2 (two) times daily. - Cholecalciferol (VITAMIN D) 50 MCG (2000 UT) tablet; Take 2,000 Units by mouth daily.  Genia Del MD, 2:53 PM 06/21/2020

## 2020-06-21 NOTE — Addendum Note (Signed)
Addended by: Berna Spare A on: 06/21/2020 04:29 PM   Modules accepted: Orders

## 2020-06-22 LAB — PAP IG W/ RFLX HPV ASCU

## 2020-06-22 LAB — VITAMIN D 25 HYDROXY (VIT D DEFICIENCY, FRACTURES): Vit D, 25-Hydroxy: 33 ng/mL (ref 30–100)

## 2020-08-03 ENCOUNTER — Ambulatory Visit (INDEPENDENT_AMBULATORY_CARE_PROVIDER_SITE_OTHER): Payer: Medicare Other

## 2020-08-03 ENCOUNTER — Other Ambulatory Visit: Payer: Self-pay | Admitting: Obstetrics & Gynecology

## 2020-08-03 ENCOUNTER — Other Ambulatory Visit: Payer: Self-pay

## 2020-08-03 DIAGNOSIS — Z780 Asymptomatic menopausal state: Secondary | ICD-10-CM

## 2020-08-03 DIAGNOSIS — M8589 Other specified disorders of bone density and structure, multiple sites: Secondary | ICD-10-CM | POA: Diagnosis not present

## 2021-06-22 ENCOUNTER — Ambulatory Visit: Payer: Self-pay | Admitting: Obstetrics & Gynecology

## 2021-07-13 ENCOUNTER — Ambulatory Visit: Payer: Medicare Other | Admitting: Obstetrics & Gynecology

## 2021-07-25 ENCOUNTER — Other Ambulatory Visit: Payer: Self-pay

## 2021-07-25 ENCOUNTER — Ambulatory Visit: Payer: Medicare Other | Admitting: Obstetrics & Gynecology

## 2021-07-25 ENCOUNTER — Encounter: Payer: Self-pay | Admitting: Obstetrics & Gynecology

## 2021-07-25 VITALS — BP 120/74 | HR 74 | Resp 20

## 2021-07-25 DIAGNOSIS — N750 Cyst of Bartholin's gland: Secondary | ICD-10-CM | POA: Diagnosis not present

## 2021-07-25 DIAGNOSIS — N949 Unspecified condition associated with female genital organs and menstrual cycle: Secondary | ICD-10-CM | POA: Diagnosis not present

## 2021-07-25 NOTE — Progress Notes (Signed)
° ° °  Anna Sparks 14-Jul-1950 [id number]        71 y.o.  G4P2A2L2     RP: Vulvo-vaginal discomfort x 1 week  HPI:  Vulvo-vaginal discomfort x 1 week. Feels swollen on the left vulva.  Burning sensation.  Postmenopause, well on no HRT.  No PMB.  No pelvic pain. No recent IC. Urine, BMs wnl.   OB History  Gravida Para Term Preterm AB Living  4 2     2 2   SAB IAB Ectopic Multiple Live Births  2            # Outcome Date GA Lbr Len/2nd Weight Sex Delivery Anes PTL Lv  4 SAB           3 SAB           2 Para           1 Para             Past medical history,surgical history, problem list, medications, allergies, family history and social history were all reviewed and documented in the EPIC chart.   Directed ROS with pertinent positives and negatives documented in the history of present illness/assessment and plan.  Exam:  Vitals:   07/25/21 1427  BP: 120/74  Pulse: 74  Resp: 20  SpO2: 99%   General appearance:  Normal  Gynecologic exam: Vulva:  Left Bartholin Cyst about 4 cm in diameter.  No erythema.  No drainage.  Informed consent obtained for Incision and Drainage of Left Bartholin Gland Cyst. Betadine prep.  Local anesthesia with 3 cc of Lidocaine 1%.  Incision with scalpel at inner aspect of the Bartholin cyst towards the hymen.  Drainage of a blood clot with pressure on the cyst. No pus. Silver Nitrate at the incision.  Well tolerated, no Cx.   Assessment/Plan:  71 y.o. G4P2A2L2   1. Vaginal discomfort D/T Left Bartholin Gland Cyst.  2. Cyst of left Bartholin's gland Incision and drainage done.  Blood clot evacuated from the Left Bartholin Gland Cyst.  No pus.  No Complication.  Will do warm soakings 3 times a day.  F/U in 2 days for reevaluation and Breast/Pelvic exam.  Other orders - cholecalciferol (VITAMIN D3) 25 MCG (1000 UNIT) tablet; Take 4,000 Units by mouth daily.   61 MD, 2:42 PM 07/25/2021

## 2021-07-26 LAB — URINALYSIS, COMPLETE W/RFL CULTURE
Bacteria, UA: NONE SEEN /HPF
Bilirubin Urine: NEGATIVE
Glucose, UA: NEGATIVE
Hgb urine dipstick: NEGATIVE
Hyaline Cast: NONE SEEN /LPF
Ketones, ur: NEGATIVE
Leukocyte Esterase: NEGATIVE
Nitrites, Initial: NEGATIVE
Protein, ur: NEGATIVE
RBC / HPF: NONE SEEN /HPF (ref 0–2)
Specific Gravity, Urine: 1.007 (ref 1.001–1.035)
Squamous Epithelial / HPF: NONE SEEN /HPF (ref <=5)
WBC, UA: NONE SEEN /HPF (ref 0–5)
pH: 6 (ref 5.0–8.0)

## 2021-07-26 LAB — NO CULTURE INDICATED

## 2021-07-27 ENCOUNTER — Ambulatory Visit (INDEPENDENT_AMBULATORY_CARE_PROVIDER_SITE_OTHER): Payer: Medicare Other | Admitting: Obstetrics & Gynecology

## 2021-07-27 ENCOUNTER — Other Ambulatory Visit: Payer: Self-pay

## 2021-07-27 ENCOUNTER — Encounter: Payer: Self-pay | Admitting: Obstetrics & Gynecology

## 2021-07-27 VITALS — BP 110/72 | HR 74 | Resp 16 | Ht 63.25 in | Wt 180.0 lb

## 2021-07-27 DIAGNOSIS — Z01419 Encounter for gynecological examination (general) (routine) without abnormal findings: Secondary | ICD-10-CM | POA: Diagnosis not present

## 2021-07-27 DIAGNOSIS — Z6831 Body mass index (BMI) 31.0-31.9, adult: Secondary | ICD-10-CM

## 2021-07-27 DIAGNOSIS — M8589 Other specified disorders of bone density and structure, multiple sites: Secondary | ICD-10-CM

## 2021-07-27 DIAGNOSIS — E6609 Other obesity due to excess calories: Secondary | ICD-10-CM

## 2021-07-27 DIAGNOSIS — N750 Cyst of Bartholin's gland: Secondary | ICD-10-CM

## 2021-07-27 DIAGNOSIS — Z780 Asymptomatic menopausal state: Secondary | ICD-10-CM

## 2021-07-27 NOTE — Progress Notes (Signed)
Anna Sparks 05-28-1951 [id number]   History:    71 y.o. G70P2A2L2 Married.  2 grand-sons   RP:  Established patient presenting for annual gyn exam    HPI: Postmenopause, well on no HRT.  No PMB.  No pelvic pain.  Lt Bartholin Gland Cyst with blood clots drained on 07/25/2021.  Mild spotting, no pain.  Doing warm soaking.  Dryness and pain with IC, recommend coconut oil. Pap 06/2020 Negative. Urine, BMs wnl.  Breasts wnl.  Mammo 07/2020 Negative.  H/O ankle fracture.  Bone Density 08/2020 Osteopenia at [facility] Spine T-Score -1.8.  On Vit D supplements.  Walking.  BMI decreased to 31.63  Health labs with Fam MD.  Alen Bleacher 2020.    Past medical history,surgical history, family history and social history were all reviewed and documented in the EPIC chart.  Gynecologic History No LMP recorded. Patient is postmenopausal.  Obstetric History OB History  Gravida Para Term Preterm AB Living  4 2     2 2   SAB IAB Ectopic Multiple Live Births  2            # Outcome Date GA Lbr Len/2nd Weight Sex Delivery Anes PTL Lv  4 SAB           3 SAB           2 Para           1 Para              ROS: A ROS was performed and pertinent positives and negatives are included in the history.  GENERAL: No fevers or chills. HEENT: No change in vision, no earache, sore throat or sinus congestion. NECK: No pain or stiffness. CARDIOVASCULAR: No chest pain or pressure. No palpitations. PULMONARY: No shortness of breath, cough or wheeze. GASTROINTESTINAL: No abdominal pain, nausea, vomiting or diarrhea, melena or bright red blood per rectum. GENITOURINARY: No urinary frequency, urgency, hesitancy or dysuria. MUSCULOSKELETAL: No joint or muscle pain, no back pain, no recent trauma. DERMATOLOGIC: No rash, no itching, no lesions. ENDOCRINE: No polyuria, polydipsia, no heat or cold intolerance. No recent change in weight. HEMATOLOGICAL: No anemia or easy bruising or bleeding. NEUROLOGIC: No headache, seizures, numbness,  tingling or weakness. PSYCHIATRIC: No depression, no loss of interest in normal activity or change in sleep pattern.     Exam:   BP 110/72    Pulse 74    Resp 16    Ht 5' 3.25" (1.607 m)    Wt 180 lb (81.6 kg)    BMI 31.63 kg/m   Body mass index is 31.63 kg/m.  General appearance : Well developed well nourished female. No acute distress HEENT: Eyes: no retinal hemorrhage or exudates,  Neck supple, trachea midline, no carotid bruits, no thyroidmegaly Lungs: Clear to auscultation, no rhonchi or wheezes, or rib retractions  Heart: Regular rate and rhythm, no murmurs or gallops Breast:Examined in sitting and supine position were symmetrical in appearance, no palpable masses or tenderness,  no skin retraction, no nipple inversion, no nipple discharge, no skin discoloration, no axillary or supraclavicular lymphadenopathy Abdomen: no palpable masses or tenderness, no rebound or guarding Extremities: no edema or skin discoloration or tenderness  Pelvic: Vulva: Rt normal.  Lt healing from I+D Bartholin Gland with blood clots on 07/25/2021.  No active bleeding.  No Bartholin Gland Cyst palpable currently.  Non-tender.             Vagina: No gross lesions or discharge  Cervix: No gross lesions or discharge  Uterus  AV, normal size, shape and consistency, non-tender and mobile  Adnexa  Without masses or tenderness  Anus: Normal   Assessment/Plan:  71 y.o. female for annual exam   1. Well female exam with routine gynecological exam Postmenopause, well on no HRT.  No PMB.  No pelvic pain.  Lt Bartholin Gland Cyst with blood clots drained on 07/25/2021.  Mild spotting, no pain.  Doing warm soaking.  Dryness and pain with IC, recommend coconut oil. Pap 06/2020 Negative. Urine, BMs wnl.  Breasts wnl.  Mammo 07/2020 Negative.  H/O ankle fracture.  Bone Density 08/2020 Osteopenia at [facility] Spine T-Score -1.8.  On Vit D supplements.  Walking.  BMI decreased to 31.63  Health labs with Fam MD.  Alen Bleacher 2020.   2.  Postmenopause Postmenopause, well on no HRT.  No PMB.  No pelvic pain.  3. Osteopenia of multiple sites  Bone Density 08/2020 Osteopenia at [facility] Spine T-Score -1.8.  On Vit D supplements.  Walking.    4. Cyst of left Bartholin's gland Lt Bartholin Gland Cyst with blood clots drained on 07/25/2021.  Mild spotting, no pain.  Doing warm soaking.  Precautions discussed, will call for reevaluation if reforms and becomes tender.  5. Class 1 obesity due to excess calories with serious comorbidity and body mass index (BMI) of 31.0 to 31.9 in adult Lower calorie/carb diet. Increase fitness activities.  Other orders - VITAMIN D PO; Take 4,000 Int'l Units by mouth daily.   Genia Del MD, 2:39 PM 07/27/2021

## 2021-08-30 ENCOUNTER — Encounter: Payer: Self-pay | Admitting: Obstetrics & Gynecology

## 2021-08-30 ENCOUNTER — Other Ambulatory Visit: Payer: Self-pay

## 2021-08-30 ENCOUNTER — Ambulatory Visit: Payer: Medicare Other | Admitting: Obstetrics & Gynecology

## 2021-08-30 VITALS — BP 110/70

## 2021-08-30 DIAGNOSIS — N750 Cyst of Bartholin's gland: Secondary | ICD-10-CM

## 2021-08-30 DIAGNOSIS — R102 Pelvic and perineal pain: Secondary | ICD-10-CM

## 2021-08-30 NOTE — Progress Notes (Signed)
° ° °  Anna Sparks 1950/12/02 [id number]        70 y.o.  [id number]   RP: Tender at the site of the drained Lt Bartholin Gland Cyst  HPI: Mild tenderness at the site of drainage of the Left Bartholin Cyst.  Worse after patient takes walks.  Left Bartholin Cyst drained on 07/25/2021.  Blood clots evacuated.  Stopped doing Sitz baths about 2 weeks ago.  No recurrence of cyst and no drainage or bleeding.  No fever.   OB History  Gravida Para Term Preterm AB Living  4 2     2 2   SAB IAB Ectopic Multiple Live Births  2            # Outcome Date GA Lbr Len/2nd Weight Sex Delivery Anes PTL Lv  4 SAB           3 SAB           2 Para           1 Para             Past medical history,surgical history, problem list, medications, allergies, family history and social history were all reviewed and documented in the EPIC chart.   Directed ROS with pertinent positives and negatives documented in the history of present illness/assessment and plan.  Exam:  Vitals:   08/30/21 1405  BP: 110/70   General appearance:  Normal  Gynecologic exam: Vulva normal.  Left Bartholin gland normal, no cyst or abscess.  Very mild induration with tenderness at the site of previous incision.  No drainage or bleeding.  No erythema.   Assessment/Plan:  71 y.o. [id number]   1. Tenderness of left vulva Mild tenderness at the site of drainage of the Left Bartholin Cyst.  Worse after patient takes walks.  Left Bartholin Cyst drained on 07/25/2021.  Blood clots evacuated.  Stopped doing Sitz baths about 2 weeks ago.  No recurrence of cyst and no drainage or bleeding.  No fever.  Vulvar exam:  Vulva normal.  Left Bartholin gland normal, no cyst or abscess.  Very mild induration with tenderness at the site of previous incision.  No drainage or bleeding.  No erythema.  Recommend to restart Sitz baths once a day.  Apply Vaseline before walks to reduce friction.  Hydroxycortisone 1 % ointment at bedtime as needed.  2. H/O Cyst  of left Bartholin's gland post drainage No recurrence.    Other orders - Multiple Vitamins-Minerals (PRESERVISION AREDS 2 PO); Take by mouth.   Princess Bruins MD, 2:13 PM 08/30/2021

## 2022-07-06 ENCOUNTER — Other Ambulatory Visit: Payer: Self-pay

## 2022-07-06 DIAGNOSIS — M8589 Other specified disorders of bone density and structure, multiple sites: Secondary | ICD-10-CM

## 2022-07-06 DIAGNOSIS — Z13820 Encounter for screening for osteoporosis: Secondary | ICD-10-CM

## 2022-07-28 ENCOUNTER — Encounter: Payer: Self-pay | Admitting: Obstetrics & Gynecology

## 2022-07-28 ENCOUNTER — Ambulatory Visit (INDEPENDENT_AMBULATORY_CARE_PROVIDER_SITE_OTHER): Payer: Medicare Other | Admitting: Obstetrics & Gynecology

## 2022-07-28 VITALS — BP 116/74 | HR 82 | Ht 63.0 in | Wt 179.0 lb

## 2022-07-28 DIAGNOSIS — M8589 Other specified disorders of bone density and structure, multiple sites: Secondary | ICD-10-CM

## 2022-07-28 DIAGNOSIS — Z01419 Encounter for gynecological examination (general) (routine) without abnormal findings: Secondary | ICD-10-CM

## 2022-07-28 DIAGNOSIS — Z780 Asymptomatic menopausal state: Secondary | ICD-10-CM

## 2022-07-28 NOTE — Progress Notes (Signed)
Anna Sparks 10/14/1950 [id number]   History:    72 y.o. G39P2A2L2 Married.  2 grand-sons   RP:  Established patient presenting for annual gyn exam    HPI: Postmenopause, well on no HRT.  No PMB.  No pelvic pain.  Lt Bartholin Gland Cyst with blood clots drained on 07/25/2021.  No bleeding or discharge, no pain. Feels a lump when sitting on left side.  Dryness and pain with IC, well with coconut oil. Pap 06/2020 Neg.  No indication to repeat a Pap test at this time.  Urine, BMs wnl. Breasts wnl.  Mammo 05/12/22 Rt Neg, Lt Dx mammo/US  Neg 06/05/22.  H/O ankle fracture.  Bone Density 08/2020 Osteopenia at [facility] Spine T-Score -1.8.  Scheduled to repeat BD 08/2022.  On Vit D supplements.  Walking.  BMI 31.71.  Health labs with [place]. Flu vaccine at pharmacy.   Past medical history,surgical history, family history and social history were all reviewed and documented in the EPIC chart.  Gynecologic History No LMP recorded. Patient is postmenopausal.  Obstetric History OB History  Gravida Para Term Preterm AB Living  4 2 2   2 2   SAB IAB Ectopic Multiple Live Births  2            # Outcome Date GA Lbr Len/2nd Weight Sex Delivery Anes PTL Lv  4 SAB           3 SAB           2 Term           1 Term              ROS: A ROS was performed and pertinent positives and negatives are included in the history. GENERAL: No fevers or chills. HEENT: No change in vision, no earache, sore throat or sinus congestion. NECK: No pain or stiffness. CARDIOVASCULAR: No chest pain or pressure. No palpitations. PULMONARY: No shortness of breath, cough or wheeze. GASTROINTESTINAL: No abdominal pain, nausea, vomiting or diarrhea, melena or bright red blood per rectum. GENITOURINARY: No urinary frequency, urgency, hesitancy or dysuria. MUSCULOSKELETAL: No joint or muscle pain, no back pain, no recent trauma. DERMATOLOGIC: No rash, no itching, no lesions. ENDOCRINE: No polyuria, polydipsia, no heat or cold  intolerance. No recent change in weight. HEMATOLOGICAL: No anemia or easy bruising or bleeding. NEUROLOGIC: No headache, seizures, numbness, tingling or weakness. PSYCHIATRIC: No depression, no loss of interest in normal activity or change in sleep pattern.     Exam:   BP 116/74   Pulse 82   Ht 5\' 3"  (1.6 m)   Wt 179 lb (81.2 kg)   SpO2 97%   BMI 31.71 kg/m   Body mass index is 31.71 kg/m.  General appearance : Well developed well nourished female. No acute distress HEENT: Eyes: no retinal hemorrhage or exudates,  Neck supple, trachea midline, no carotid bruits, no thyroidmegaly Lungs: Clear to auscultation, no rhonchi or wheezes, or rib retractions  Heart: Regular rate and rhythm, no murmurs or gallops Breast:Examined in sitting and supine position were symmetrical in appearance, no palpable masses or tenderness,  no skin retraction, no nipple inversion, no nipple discharge, no skin discoloration, no axillary or supraclavicular lymphadenopathy Abdomen: no palpable masses or tenderness, no rebound or guarding Extremities: no edema or skin discoloration or tenderness  Pelvic: Vulva: Normal.  Ischium normal bilaterally.             Vagina: No gross lesions or  discharge  Cervix: No gross lesions or discharge  Uterus  AV, normal size, shape and consistency, non-tender and mobile  Adnexa  Without masses or tenderness  Anus: Normal   Assessment/Plan:  72 y.o. female for annual exam   1. Well female exam with routine gynecological exam Postmenopause, well on no HRT.  No PMB.  No pelvic pain.  Lt Bartholin Gland Cyst with blood clots drained on 07/25/2021.  No bleeding or discharge, no pain. Feels a lump when sitting on left side.  Dryness and pain with IC, well with coconut oil. Pap 06/2020 Neg.  No indication to repeat a Pap test at this time.  Urine, BMs wnl. Breasts wnl.  Mammo 05/12/22 Rt Neg, Lt Dx mammo/US  Neg 06/05/22.  H/O ankle fracture.  Bone Density 08/2020 Osteopenia at [facility]  Spine T-Score -1.8.  Scheduled to repeat BD 08/2022.  On Vit D supplements.  Walking.  BMI 31.71.  Health labs with [place]. Flu vaccine at pharmacy.  2. Postmenopause Postmenopause, well on no HRT.  No PMB.  No pelvic pain.    3. Osteopenia of multiple sites  Bone Density 08/2020 Osteopenia at [facility] Spine T-Score -1.8.  Scheduled to repeat BD 08/2022.  On Vit D supplements.  Walking.  Princess Bruins MD, 2:34 PM

## 2022-08-22 ENCOUNTER — Other Ambulatory Visit: Payer: Self-pay | Admitting: Obstetrics & Gynecology

## 2022-08-22 ENCOUNTER — Ambulatory Visit (INDEPENDENT_AMBULATORY_CARE_PROVIDER_SITE_OTHER): Payer: Medicare Other

## 2022-08-22 DIAGNOSIS — Z780 Asymptomatic menopausal state: Secondary | ICD-10-CM

## 2022-08-22 DIAGNOSIS — M8589 Other specified disorders of bone density and structure, multiple sites: Secondary | ICD-10-CM

## 2022-08-22 DIAGNOSIS — Z13820 Encounter for screening for osteoporosis: Secondary | ICD-10-CM

## 2023-04-24 ENCOUNTER — Telehealth: Payer: Self-pay | Admitting: *Deleted

## 2023-04-24 NOTE — Telephone Encounter (Signed)
Mrs. Anna Sparks is an established patient that was last seen in Nov.2019 by Dr. Edilia Sparks (stab phlebectomy Right leg ) and Anna Sparks in December 2019 for sclerotherapy.  She also has history of endovenous laser ablation of right greater saphenous vein by Dr. Hart Sparks in 2010 and stripping of her left greater saphenous vein by a general surgeon in 1998.  Currently she states she has "puffy, ropey veins " bilateral lower extremities and requests a re-evaluation with an MD.  Will have schedulers book a new vein appointment (> 3 years since her last visit) and venous reflux exam and call patient with dates and times. Mrs. Anna Sparks in agreement with the plan.

## 2023-06-21 ENCOUNTER — Encounter: Payer: Medicare Other | Admitting: Vascular Surgery

## 2023-06-21 ENCOUNTER — Encounter (HOSPITAL_COMMUNITY): Payer: Self-pay

## 2023-06-22 ENCOUNTER — Encounter (HOSPITAL_COMMUNITY): Payer: Self-pay

## 2023-06-22 ENCOUNTER — Encounter: Payer: Self-pay | Admitting: Vascular Surgery

## 2023-07-26 ENCOUNTER — Other Ambulatory Visit: Payer: Self-pay

## 2023-07-26 DIAGNOSIS — I872 Venous insufficiency (chronic) (peripheral): Secondary | ICD-10-CM

## 2023-08-06 ENCOUNTER — Encounter (HOSPITAL_COMMUNITY): Payer: Medicare Other

## 2023-08-09 ENCOUNTER — Encounter: Payer: Self-pay | Admitting: Vascular Surgery

## 2023-08-09 ENCOUNTER — Ambulatory Visit: Payer: Medicare Other | Admitting: Vascular Surgery

## 2023-08-09 ENCOUNTER — Ambulatory Visit (HOSPITAL_COMMUNITY)
Admission: RE | Admit: 2023-08-09 | Discharge: 2023-08-09 | Disposition: A | Discharge status: Home / Self Care | Payer: Medicare Other | Source: Ambulatory Visit | Attending: Vascular Surgery | Admitting: Vascular Surgery

## 2023-08-09 VITALS — BP 149/82 | HR 60 | Temp 97.7°F | Resp 16 | Ht 63.0 in | Wt 163.9 lb

## 2023-08-09 DIAGNOSIS — I872 Venous insufficiency (chronic) (peripheral): Secondary | ICD-10-CM

## 2023-08-09 DIAGNOSIS — I8393 Asymptomatic varicose veins of bilateral lower extremities: Secondary | ICD-10-CM

## 2023-08-09 NOTE — Progress Notes (Signed)
 Office Note     CC: Varicose veins Requesting Provider:  Tanda Prentice DEL, MD  HPI: Anna Sparks is a 73 y.o. (Jan 14, 1951) female presenting at the request of .Tanda Prentice DEL, MD for bilateral lower extremity varicosities.  On exam, Anna Sparks was doing well.  A native of [place], she now lives in [place] on [address].  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  She has 2 children, and 2 grandchildren.  Anna Sparks has a history of bilateral lower extremity greater saphenous vein intervention, left-sided stripping, right sided laser ablation.  She is also undergone right sided stab phlebectomy x 2.  Recently, she noted 2 lumps on her leg which made her concerned.  She notes that she also has some varicosities.  She wanted to ensure that she did not have any nidus for DVT.  She continues to wear compression stockings intermittently.  Notes some mild heaviness by days end, but this is not lifestyle-limiting.  Has appreciated some varicosities which have worsened over the last 5 years.  No bleeding issues, no ulcerations.    Past Medical History:  Diagnosis Date   Hypertension    Osteopenia 03/2018   T score -1.9 FRAX 15% / 1.7%   Pneumonia     Past Surgical History:  Procedure Laterality Date   ANKLE SURGERY Right    APPENDECTOMY     LGSV stripped in 1998 Left 1998   RGSV laser ablation 2010 Right    TUBAL LIGATION      Social History   Socioeconomic History   Marital status: Married    Spouse name: Not on file   Number of children: Not on file   Years of education: Not on file   Highest education level: Not on file  Occupational History   Not on file  Tobacco Use   Smoking status: Never   Smokeless tobacco: Never  Vaping Use   Vaping status: Never Used  Substance and Sexual Activity   Alcohol use: Yes    Comment: 7 GLASSES OF WINE A WEEK    Drug use: No   Sexual activity: Not Currently    Partners: Male    Birth  control/protection: Post-menopausal, Surgical, Abstinence    Comment: 1st intercourse- 18, partners- 1, btl  Other Topics Concern   Not on file  Social History Narrative   Not on file   Social Drivers of Health   Financial Resource Strain: Not on file  Food Insecurity: Low Risk  (02/26/2023)   Received from [facility]   Hunger Vital Sign    Worried About Running Out of Food in the Last Year: Never true    Ran Out of Food in the Last Year: Never true  Transportation Needs: No Transportation Needs (02/26/2023)   Received from [facility]    In the past 12 months, has lack of reliable transportation kept you from medical appointments, meetings, work or from getting things needed for daily living? : No  Physical Activity: Not on file  Stress: Not on file  Social Connections: Unknown (11/14/2021)   Received from [facility], [facility]   Social Network    Social Network: Not on file  Intimate Partner Violence: Unknown (10/05/2021)   Received from [facility], [facility]   HITS    Physically Hurt: Not on file    Insult or Talk Down To: Not on file    Threaten Physical Harm: Not on file  Scream or Curse: Not on file   Family History  Problem Relation Age of Onset   Cancer Mother        colon   Hypertension Mother    Hypertension Father    Hypertension Sister    Prostate cancer Brother    Breast cancer Maternal Aunt     Current Outpatient Medications  Medication Sig Dispense Refill   lisinopril-hydrochlorothiazide (PRINZIDE,ZESTORETIC) 10-12.5 MG tablet Take 1 tablet by mouth daily.      metoprolol succinate (TOPROL-XL) 50 MG 24 hr tablet Take 50 mg by mouth daily.     Multiple Vitamins-Minerals (PRESERVISION AREDS 2 PO) Take by mouth.     Omega-3 1000 MG CAPS Take 1 tablet by mouth daily.     VITAMIN D  PO Take 4,000 Int'l Units by mouth daily.     No current facility-administered medications for this visit.    No Known Allergies   REVIEW  OF SYSTEMS:  [X]  denotes positive finding, [ ]  denotes negative finding Cardiac  Comments:  Chest pain or chest pressure:    Shortness of breath upon exertion:    Short of breath when lying flat:    Irregular heart rhythm:        Vascular    Pain in calf, thigh, or hip brought on by ambulation:    Pain in feet at night that wakes you up from your sleep:     Blood clot in your veins:    Leg swelling:         Pulmonary    Oxygen at home:    Productive cough:     Wheezing:         Neurologic    Sudden weakness in arms or legs:     Sudden numbness in arms or legs:     Sudden onset of difficulty speaking or slurred speech:    Temporary loss of vision in one eye:     Problems with dizziness:         Gastrointestinal    Blood in stool:     Vomited blood:         Genitourinary    Burning when urinating:     Blood in urine:        Psychiatric    Major depression:         Hematologic    Bleeding problems:    Problems with blood clotting too easily:        Skin    Rashes or ulcers:        Constitutional    Fever or chills:      PHYSICAL EXAMINATION:  Vitals:   08/09/23 1506  BP: (!) 149/82  Pulse: 60  Resp: 16  Temp: 97.7 F (36.5 C)  SpO2: 96%  Weight: 163 lb 14.4 oz (74.3 kg)  Height: 5' 3 (1.6 m)    General:  WDWN in NAD; vital signs documented above Gait: Not observed HENT: WNL, normocephalic Pulmonary: normal non-labored breathing , without wheezing Cardiac: regular HR Abdomen: soft, NT, no masses Skin: without rashes Vascular Exam/Pulses:  Right Left  Radial 2+ (normal) 2+ (normal)  Ulnar    Femoral    Popliteal    DP 2+ (normal) 2+ (normal)  PT     Extremities: without ischemic changes, without Gangrene , without cellulitis; without open wounds;  Musculoskeletal: no muscle wasting or atrophy  Neurologic: A&O X 3;  No focal weakness or paresthesias are detected Psychiatric:  The pt has Normal affect.  Non-Invasive Vascular Imaging:    Venous Reflux Times  +--------------+---------+------+-----------+------------+-------------+  RIGHT        Reflux NoRefluxReflux TimeDiameter cmsComments                               Yes                                        +--------------+---------+------+-----------+------------+-------------+  CFV                    yes   >1 second                            +--------------+---------+------+-----------+------------+-------------+  FV mid                  yes   >1 second                            +--------------+---------+------+-----------+------------+-------------+  Popliteal              yes   >1 second                            +--------------+---------+------+-----------+------------+-------------+  GSV at SFJ              yes    >500 ms      0.66                   +--------------+---------+------+-----------+------------+-------------+  GSV prox thigh          yes    >500 ms      0.53                   +--------------+---------+------+-----------+------------+-------------+  GSV mid thigh                                       NV             +--------------+---------+------+-----------+------------+-------------+  GSV prox calf                                       out of fascia  +--------------+---------+------+-----------+------------+-------------+  GSV mid calf  no                            0.34                   +--------------+---------+------+-----------+------------+-------------+  GSV dist calf no                            0.29                   +--------------+---------+------+-----------+------------+-------------+  SSV prox calf                                       NV             +--------------+---------+------+-----------+------------+-------------+  SSV mid calf            yes    >500 ms      0.46                    +--------------+---------+------+-----------+------------+-------------+  AASV O                  yes    >500 ms      0.46                   +--------------+---------+------+-----------+------------+-------------+  AASV P                  yes    >500 ms      0.39                   +--------------+---------+------+-----------+------------+-------------+     ASSESSMENT/PLAN: Anna Sparks is a 73 y.o. female presenting with varicose veins that are progressed over the last 5 years.  She also noted to lumps, which have since resolved.  In the right lower extremity was reviewed it appears that she does have some recanalization of the GSV, but not in its entirety. It remains closed at the mid-thigh. WE discussed that she is at no higher risk of developing deep venous thrombosis with chronic venous insufficiency. We discussed that if she notes increased heaviness, pain in the lower extremities, we can discuss repeat stab phelbectomy, but the GSV remains ablated at the distal thigh.  Anna Sparks would benefit from continued use of compression stockings and elevation.  She felt comfortable following up as needed at this time.    Fonda FORBES Rim, MD [facility] [phone] Total time of patient care including pre-visit research, consultation, and documentation greater than 30 minutes

## 2024-04-09 ENCOUNTER — Ambulatory Visit: Admitting: Orthopaedic Surgery

## 2024-04-09 ENCOUNTER — Other Ambulatory Visit

## 2024-04-09 ENCOUNTER — Encounter: Payer: Self-pay | Admitting: Orthopaedic Surgery

## 2024-04-09 DIAGNOSIS — M25551 Pain in right hip: Secondary | ICD-10-CM

## 2024-04-09 NOTE — Progress Notes (Signed)
 The patient is a very nice 73 year old female who comes in about 3 months worth of right hip pain.  She mainly points to the trochanteric area as a source of her pain with no known injury.  She says most of her problems are when she is sitting in her in a chair with her legs on the ottoman and she does get some hip pain and some thigh pain.  She denies any numbness and tingling.  She does have a remote history of a right ankle fracture that required surgery by Dr. Harden.  She denies any groin pain.  She says it hurts mostly when walking.  She denies any back pain.  On exam her right and left hip moves fluidly and fluidly.  When I have her lay on her back and turn to the wall with the right hip up there is some mild pain to palpation of the trochanteric area and she says that is the main source of her pain.  There is no ischial pain and no significant pain down by her knee.  Standing AP pelvis and lateral of right hip shows normal-appearing hips bilaterally in terms of the hip joint space.  I did show her stretching exercises to try for hip tendinitis and bursitis.  She can also try topical Voltaren gel.  I have encouraged her to not sit so much in terms of her legs on the ottoman to see if this will change things.  If this worsens she can come back and see us  and we can also set her up for outpatient physical therapy for any modalities over the trochanteric area.  I did offer steroid injection but she said she does not need this right now.  This was more reassurance Kelly that her hip joints themselves look okay.

## 2024-05-05 ENCOUNTER — Encounter: Payer: Self-pay | Admitting: Radiology
